# Patient Record
Sex: Female | Born: 1975 | Race: White | Hispanic: No | Marital: Single | Smoking: Current every day smoker
Health system: Southern US, Community
[De-identification: ages and names within clinical notes are randomized; demographics above are authoritative.]

## PROBLEM LIST (undated history)

## (undated) DIAGNOSIS — E119 Type 2 diabetes mellitus without complications: Secondary | ICD-10-CM

## (undated) HISTORY — PX: TUBAL LIGATION: SHX77

---

## 2006-02-02 ENCOUNTER — Emergency Department: Payer: Self-pay | Admitting: Emergency Medicine

## 2007-07-29 ENCOUNTER — Ambulatory Visit: Payer: Self-pay | Admitting: Family Medicine

## 2008-10-25 ENCOUNTER — Ambulatory Visit: Payer: Self-pay | Admitting: Pain Medicine

## 2010-01-30 ENCOUNTER — Emergency Department: Payer: Self-pay | Admitting: Internal Medicine

## 2011-10-13 ENCOUNTER — Emergency Department: Payer: Self-pay | Admitting: Unknown Physician Specialty

## 2011-10-13 LAB — COMPREHENSIVE METABOLIC PANEL
Albumin: 4 g/dL (ref 3.4–5.0)
Alkaline Phosphatase: 80 U/L (ref 50–136)
Anion Gap: 3 — ABNORMAL LOW (ref 7–16)
BUN: 17 mg/dL (ref 7–18)
Calcium, Total: 8.9 mg/dL (ref 8.5–10.1)
Chloride: 107 mmol/L (ref 98–107)
Co2: 27 mmol/L (ref 21–32)
Creatinine: 1.07 mg/dL (ref 0.60–1.30)
EGFR (African American): >60
EGFR (Non-African Amer.): >60
Osmolality: 275 (ref 275–301)
SGOT(AST): 13 U/L — ABNORMAL LOW (ref 15–37)
SGPT (ALT): 32 U/L
Sodium: 137 mmol/L (ref 136–145)

## 2011-10-13 LAB — CBC
HCT: 40.9 % (ref 35.0–47.0)
MCH: 29.1 pg (ref 26.0–34.0)
MCHC: 33.2 g/dL (ref 32.0–36.0)
MCV: 88 fL (ref 80–100)
RBC: 4.68 10*6/uL (ref 3.80–5.20)
RDW: 13.9 % (ref 11.5–14.5)

## 2012-09-11 ENCOUNTER — Emergency Department: Payer: Self-pay | Admitting: Emergency Medicine

## 2012-09-11 LAB — URINALYSIS, COMPLETE
Bacteria: NONE SEEN
Bilirubin,UR: NEGATIVE
Bilirubin,UR: NEGATIVE
Glucose,UR: NEGATIVE mg/dL (ref 0–75)
Leukocyte Esterase: NEGATIVE
Nitrite: NEGATIVE
Nitrite: NEGATIVE
Ph: 6 (ref 4.5–8.0)
Ph: 6 (ref 4.5–8.0)
Protein: 100
Protein: 30
Squamous Epithelial: 3

## 2012-09-11 LAB — COMPREHENSIVE METABOLIC PANEL
Albumin: 3.5 g/dL (ref 3.4–5.0)
Anion Gap: 9 (ref 7–16)
BUN: 14 mg/dL (ref 7–18)
Co2: 23 mmol/L (ref 21–32)
Creatinine: 0.97 mg/dL (ref 0.60–1.30)
EGFR (African American): >60
Osmolality: 275 (ref 275–301)
Potassium: 3.7 mmol/L (ref 3.5–5.1)
SGOT(AST): 23 U/L (ref 15–37)
SGPT (ALT): 40 U/L (ref 12–78)

## 2012-09-11 LAB — CBC
HCT: 43 % (ref 35.0–47.0)
HGB: 14.7 g/dL (ref 12.0–16.0)
MCH: 28.5 pg (ref 26.0–34.0)
MCV: 83 fL (ref 80–100)
Platelet: 187 10*3/uL (ref 150–440)
RBC: 5.17 10*6/uL (ref 3.80–5.20)
RDW: 13.3 % (ref 11.5–14.5)
WBC: 9.2 10*3/uL (ref 3.6–11.0)

## 2013-04-02 ENCOUNTER — Ambulatory Visit: Payer: Self-pay | Admitting: Urology

## 2013-04-16 ENCOUNTER — Ambulatory Visit: Payer: Self-pay | Admitting: Urology

## 2013-05-01 ENCOUNTER — Encounter: Payer: Self-pay | Admitting: Urology

## 2013-05-20 ENCOUNTER — Ambulatory Visit: Payer: Self-pay | Admitting: Urology

## 2013-06-14 ENCOUNTER — Ambulatory Visit: Payer: Self-pay | Admitting: Urology

## 2014-04-29 ENCOUNTER — Emergency Department: Payer: Self-pay | Admitting: Student

## 2015-05-16 ENCOUNTER — Encounter: Payer: Self-pay | Admitting: Emergency Medicine

## 2015-05-16 ENCOUNTER — Emergency Department
Admission: EM | Admit: 2015-05-16 | Discharge: 2015-05-16 | Disposition: A | Discharge status: Home / Self Care | Payer: Medicaid Other | Attending: Emergency Medicine | Admitting: Emergency Medicine

## 2015-05-16 DIAGNOSIS — F17200 Nicotine dependence, unspecified, uncomplicated: Secondary | ICD-10-CM | POA: Insufficient documentation

## 2015-05-16 DIAGNOSIS — R42 Dizziness and giddiness: Secondary | ICD-10-CM | POA: Diagnosis not present

## 2015-05-16 DIAGNOSIS — R112 Nausea with vomiting, unspecified: Secondary | ICD-10-CM | POA: Diagnosis not present

## 2015-05-16 DIAGNOSIS — E119 Type 2 diabetes mellitus without complications: Secondary | ICD-10-CM | POA: Diagnosis not present

## 2015-05-16 HISTORY — DX: Type 2 diabetes mellitus without complications: E11.9

## 2015-05-16 LAB — CBC
HCT: 42.3 % (ref 35.0–47.0)
Hemoglobin: 13.7 g/dL (ref 12.0–16.0)
MCH: 27.4 pg (ref 26.0–34.0)
MCHC: 32.3 g/dL (ref 32.0–36.0)
MCV: 84.8 fL (ref 80.0–100.0)
Platelets: 293 10*3/uL (ref 150–440)
RBC: 4.99 MIL/uL (ref 3.80–5.20)
RDW: 14.3 % (ref 11.5–14.5)
WBC: 13.4 10*3/uL — AB (ref 3.6–11.0)

## 2015-05-16 LAB — BASIC METABOLIC PANEL
Anion gap: 7 (ref 5–15)
BUN: 15 mg/dL (ref 6–20)
CHLORIDE: 106 mmol/L (ref 101–111)
CO2: 27 mmol/L (ref 22–32)
Calcium: 9.2 mg/dL (ref 8.9–10.3)
Creatinine, Ser: 0.9 mg/dL (ref 0.44–1.00)
GFR calc Af Amer: >60 mL/min (ref >60)
GFR calc non Af Amer: >60 mL/min (ref >60)
GLUCOSE: 150 mg/dL — AB (ref 65–99)
POTASSIUM: 4.1 mmol/L (ref 3.5–5.1)
SODIUM: 140 mmol/L (ref 135–145)

## 2015-05-16 LAB — TROPONIN I: Troponin I: <0.03 ng/mL (ref <0.031)

## 2015-05-16 NOTE — ED Notes (Addendum)
Pt has been on byetta insulin shot for one yr.  It frequently makes her feel bad but past 2 days has been dizziness and nausea/vomiting.  Feels like going to pass out.  Blood sugar with ems 188. Endocrinologist aware med makes her sick and cannot see her until mid February.  Also c/o headache.

## 2015-05-17 ENCOUNTER — Telehealth: Payer: Self-pay | Admitting: Emergency Medicine

## 2015-05-17 ENCOUNTER — Emergency Department
Admission: EM | Admit: 2015-05-17 | Discharge: 2015-05-17 | Disposition: A | Discharge status: Home / Self Care | Payer: Medicaid Other | Attending: Emergency Medicine | Admitting: Emergency Medicine

## 2015-05-17 ENCOUNTER — Emergency Department: Payer: Medicaid Other

## 2015-05-17 ENCOUNTER — Encounter: Payer: Self-pay | Admitting: Emergency Medicine

## 2015-05-17 DIAGNOSIS — R42 Dizziness and giddiness: Secondary | ICD-10-CM

## 2015-05-17 DIAGNOSIS — F17200 Nicotine dependence, unspecified, uncomplicated: Secondary | ICD-10-CM | POA: Insufficient documentation

## 2015-05-17 DIAGNOSIS — G43809 Other migraine, not intractable, without status migrainosus: Secondary | ICD-10-CM

## 2015-05-17 DIAGNOSIS — R51 Headache: Secondary | ICD-10-CM | POA: Diagnosis present

## 2015-05-17 DIAGNOSIS — E119 Type 2 diabetes mellitus without complications: Secondary | ICD-10-CM | POA: Diagnosis not present

## 2015-05-17 LAB — URINALYSIS COMPLETE WITH MICROSCOPIC (ARMC ONLY)
GLUCOSE, UA: NEGATIVE mg/dL
HGB URINE DIPSTICK: NEGATIVE
LEUKOCYTES UA: NEGATIVE
NITRITE: NEGATIVE
Protein, ur: 100 mg/dL — AB
SPECIFIC GRAVITY, URINE: 1.039 — AB (ref 1.005–1.030)
pH: 5 (ref 5.0–8.0)

## 2015-05-17 LAB — GLUCOSE, CAPILLARY: GLUCOSE-CAPILLARY: 115 mg/dL — AB (ref 65–99)

## 2015-05-17 MED ORDER — MECLIZINE HCL 25 MG PO TABS: 25.0000 mg | ORAL_TABLET | Freq: Three times a day (TID) | ORAL | Status: DC | PRN

## 2015-05-17 NOTE — ED Provider Notes (Signed)
[facility] Emergency Department Provider Note   ____________________________________________  Time seen: ~1935  I have reviewed the triage vital signs and the nursing notes.   HISTORY  Chief Complaint Headache   History limited by: Not Limited   HPI Angela Velasquez is a 40 y.o. female who presented to the emergency department with concerns for continued episodes of dizziness. The patient states that she has been having dizziness with some nausea and vomiting for the past roughly 3 days. She states that the episodes will happen seemingly at random. She has woken up from sleep because of these episodes. They will last 5-10 minutes. She describes a sense of the room spinning around her. She denied any associated chest pain or shortness of breath. She did have a headache earlier today. She states it is like her migraines. She was seen at her primary care doctor where she had a shot of Toradol which helped relieve the headache.     Past Medical History  Diagnosis Date  . Diabetes mellitus without complication (HCC)     There are no active problems to display for this patient.   Past Surgical History  Procedure Laterality Date  . Tubal ligation      No current outpatient prescriptions on file.  Allergies Review of patient's allergies indicates no known allergies.  No family history on file.  Social History Social History  Substance Use Topics  . Smoking status: Current Every Day Smoker  . Smokeless tobacco: None  . Alcohol Use: No    Review of Systems  Constitutional: Negative for fever. Cardiovascular: Negative for chest pain. Respiratory: Negative for shortness of breath. Gastrointestinal: Negative for abdominal pain, vomiting and diarrhea. Neurological: Positive for headache  10-point ROS otherwise negative.  ____________________________________________   PHYSICAL EXAM:  VITAL SIGNS: ED Triage Vitals  Enc Vitals Group     BP  05/17/15 1746 135/76 mmHg     Pulse Rate 05/17/15 1746 69     Resp 05/17/15 1746 20     Temp --      Temp src --      SpO2 05/17/15 1746 96 %     Weight --      Height --      Head Cir --      Peak Flow --      Pain Score 05/17/15 1457 5     Pain Loc --    Constitutional: Alert and oriented. Well appearing and in no distress. Eyes: Conjunctivae are normal. PERRL. Normal extraocular movements. ENT   Head: Normocephalic and atraumatic.   Nose: No congestion/rhinnorhea.   Mouth/Throat: Mucous membranes are moist.   Neck: No stridor. Hematological/Lymphatic/Immunilogical: No cervical lymphadenopathy. Cardiovascular: Normal rate, regular rhythm.  No murmurs, rubs, or gallops. Respiratory: Normal respiratory effort without tachypnea nor retractions. Breath sounds are clear and equal bilaterally. No wheezes/rales/rhonchi. Gastrointestinal: Soft and nontender. No distention. There is no CVA tenderness. Genitourinary: Deferred Musculoskeletal: Normal range of motion in all extremities. No joint effusions.  No lower extremity tenderness nor edema. Neurologic:  Normal speech and language. No gross focal neurologic deficits are appreciated.  Skin:  Skin is warm, dry and intact. No rash noted. Psychiatric: Mood and affect are normal. Speech and behavior are normal. Patient exhibits appropriate insight and judgment.  ____________________________________________    LABS (pertinent positives/negatives)  Labs Reviewed  GLUCOSE, CAPILLARY - Abnormal; Notable for the following:    Glucose-Capillary 115 (*)    All other components within normal limits  URINALYSIS COMPLETEWITH MICROSCOPIC ([facility] ONLY) - Abnormal; Notable for the following:    Color, Urine AMBER (*)    APPearance CLOUDY (*)    Bilirubin Urine 1+ (*)    Ketones, ur TRACE (*)    Specific Gravity, Urine 1.039 (*)    Protein, ur 100 (*)    Bacteria, UA RARE (*)    Squamous Epithelial / LPF 6-30 (*)    All other  components within normal limits  URINE CULTURE     ____________________________________________   EKG  None  ____________________________________________    RADIOLOGY  CT head  IMPRESSION: No acute intracranial abnormality.   ____________________________________________   PROCEDURES  Procedure(s) performed: None  Critical Care performed: No  ____________________________________________   INITIAL IMPRESSION / ASSESSMENT AND PLAN / ED COURSE  Pertinent labs & imaging results that were available during my care of the patient were reviewed by me and considered in my medical decision making (see chart for details).  Patient presented to the emergency department today because of continued episodes of intermittent dizziness. On exam patient without any current dizziness. No focal neuro findings. Given clinical history of intermittent episodes and episodes are denied and do wonder positional vertigo could be the etiology of symptoms. I doubt a central lesion given the intermittent nature. I also discussed with patient that she could be having intermittent arrhythmias given clinical story and that she should follow up with primary care and possibly cardiology. Patient's blood work yesterday without concerning findings. Patient's urine today with some white blood cells of her some squamous epithelials well. Will send off for a culture.  ____________________________________________   FINAL CLINICAL IMPRESSION(S) / ED DIAGNOSES  Final diagnoses:  Dizziness  Other type of migraine     Phineas Semen, MD 05/17/15 2238

## 2015-05-17 NOTE — ED Notes (Signed)
Family member reported to registration out front that pt in subwait texting family members that her "sugar is low and she has not ate all day." I reported this to Amy C., RN and she told me to check pt blood glucose. Did so and resulted 115. Amy C., RN notified of these results.

## 2015-05-17 NOTE — Discharge Instructions (Signed)
Please seek medical attention for any high fevers, chest pain, shortness of breath, change in behavior, persistent vomiting, bloody stool or any other new or concerning symptoms. ° ° °Dizziness °Dizziness is a common problem. It is a feeling of unsteadiness or light-headedness. You may feel like you are about to faint. Dizziness can lead to injury if you stumble or fall. Anyone can become dizzy, but dizziness is more common in older adults. This condition can be caused by a number of things, including medicines, dehydration, or illness. °HOME CARE INSTRUCTIONS °Taking these steps may help with your condition: °Eating and Drinking °· Drink enough fluid to keep your urine clear or pale yellow. This helps to keep you from becoming dehydrated. Try to drink more clear fluids, such as water. °· Do not drink alcohol. °· Limit your caffeine intake if directed by your health care provider. °· Limit your salt intake if directed by your health care provider. °Activity °· Avoid making quick movements. °¨ Rise slowly from chairs and steady yourself until you feel okay. °¨ In the morning, first sit up on the side of the bed. When you feel okay, stand slowly while you hold onto something until you know that your balance is fine. °· Move your legs often if you need to stand in one place for a long time. Tighten and relax your muscles in your legs while you are standing. °· Do not drive or operate heavy machinery if you feel dizzy. °· Avoid bending down if you feel dizzy. Place items in your home so that they are easy for you to reach without leaning over. °Lifestyle °· Do not use any tobacco products, including cigarettes, chewing tobacco, or electronic cigarettes. If you need help quitting, ask your health care provider. °· Try to reduce your stress level, such as with yoga or meditation. Talk with your health care provider if you need help. °General Instructions °· Watch your dizziness for any changes. °· Take medicines only as  directed by your health care provider. Talk with your health care provider if you think that your dizziness is caused by a medicine that you are taking. °· Tell a friend or a family member that you are feeling dizzy. If he or she notices any changes in your behavior, have this person call your health care provider. °· Keep all follow-up visits as directed by your health care provider. This is important. °SEEK MEDICAL CARE IF: °· Your dizziness does not go away. °· Your dizziness or light-headedness gets worse. °· You feel nauseous. °· You have reduced hearing. °· You have new symptoms. °· You are unsteady on your feet or you feel like the room is spinning. °SEEK IMMEDIATE MEDICAL CARE IF: °· You vomit or have diarrhea and are unable to eat or drink anything. °· You have problems talking, walking, swallowing, or using your arms, hands, or legs. °· You feel generally weak. °· You are not thinking clearly or you have trouble forming sentences. It may take a friend or family member to notice this. °· You have chest pain, abdominal pain, shortness of breath, or sweating. °· Your vision changes. °· You notice any bleeding. °· You have a headache. °· You have neck pain or a stiff neck. °· You have a fever. °  °This information is not intended to replace advice given to you by your health care provider. Make sure you discuss any questions you have with your health care provider. °  °Document Released: 09/26/2000 Document Revised: 08/17/2014   Document Reviewed: 03/29/2014 °Elsevier Interactive Patient Education ©2016 [employer] ° °

## 2015-05-17 NOTE — ED Notes (Signed)
Patient presents to the ED with headache and dizziness with nausea and vomiting.  Patient reports history of migraines but states she has never felt dizzy from the migraines previously.  Patient states headache began on Sunday.  Patient was seen in ED yesterday but left prior to being seen by a provider.  Patient went to [facility] today and was given toradol  which improved the headache but about 10 minutes after being given the medication patient states she "slid to the floor".  Patient states she was awake and alert the entire time but had just felt very dizzy.  Patient is alert and oriented at this time.  Ambulatory to triage.  Denies dizziness at this time.

## 2015-05-17 NOTE — ED Notes (Signed)
Called patient due to lwot to inquire about condition and follow up plans. Pt says she has appt today at 1pm. She agrees to let them know about labs done here.

## 2015-05-19 LAB — URINE CULTURE

## 2016-08-16 ENCOUNTER — Encounter: Payer: Self-pay | Admitting: Emergency Medicine

## 2016-08-16 ENCOUNTER — Emergency Department
Admission: EM | Admit: 2016-08-16 | Discharge: 2016-08-16 | Disposition: A | Discharge status: Home / Self Care | Payer: Medicaid Other | Attending: Emergency Medicine | Admitting: Emergency Medicine

## 2016-08-16 ENCOUNTER — Emergency Department: Payer: Medicaid Other

## 2016-08-16 DIAGNOSIS — R1110 Vomiting, unspecified: Secondary | ICD-10-CM | POA: Insufficient documentation

## 2016-08-16 DIAGNOSIS — R05 Cough: Secondary | ICD-10-CM | POA: Diagnosis present

## 2016-08-16 DIAGNOSIS — F17200 Nicotine dependence, unspecified, uncomplicated: Secondary | ICD-10-CM | POA: Diagnosis not present

## 2016-08-16 DIAGNOSIS — E119 Type 2 diabetes mellitus without complications: Secondary | ICD-10-CM | POA: Diagnosis not present

## 2016-08-16 DIAGNOSIS — J3489 Other specified disorders of nose and nasal sinuses: Secondary | ICD-10-CM | POA: Diagnosis not present

## 2016-08-16 DIAGNOSIS — R0981 Nasal congestion: Secondary | ICD-10-CM | POA: Diagnosis not present

## 2016-08-16 DIAGNOSIS — Z79899 Other long term (current) drug therapy: Secondary | ICD-10-CM | POA: Diagnosis not present

## 2016-08-16 DIAGNOSIS — R0602 Shortness of breath: Secondary | ICD-10-CM | POA: Insufficient documentation

## 2016-08-16 DIAGNOSIS — R0989 Other specified symptoms and signs involving the circulatory and respiratory systems: Secondary | ICD-10-CM | POA: Diagnosis not present

## 2016-08-16 DIAGNOSIS — Z7984 Long term (current) use of oral hypoglycemic drugs: Secondary | ICD-10-CM | POA: Insufficient documentation

## 2016-08-16 DIAGNOSIS — R059 Cough, unspecified: Secondary | ICD-10-CM

## 2016-08-16 DIAGNOSIS — R42 Dizziness and giddiness: Secondary | ICD-10-CM | POA: Insufficient documentation

## 2016-08-16 LAB — BASIC METABOLIC PANEL
ANION GAP: 7 (ref 5–15)
BUN: 12 mg/dL (ref 6–20)
CO2: 25 mmol/L (ref 22–32)
Calcium: 9.2 mg/dL (ref 8.9–10.3)
Chloride: 106 mmol/L (ref 101–111)
Creatinine, Ser: 1.1 mg/dL — ABNORMAL HIGH (ref 0.44–1.00)
GFR calc Af Amer: >60 mL/min (ref >60)
Glucose, Bld: 132 mg/dL — ABNORMAL HIGH (ref 65–99)
POTASSIUM: 4 mmol/L (ref 3.5–5.1)
Sodium: 138 mmol/L (ref 135–145)

## 2016-08-16 LAB — CBC WITH DIFFERENTIAL/PLATELET
BASOS PCT: 1 %
Basophils Absolute: 0.1 10*3/uL (ref 0–0.1)
EOS PCT: 1 %
Eosinophils Absolute: 0.2 10*3/uL (ref 0–0.7)
HCT: 43.7 % (ref 35.0–47.0)
Hemoglobin: 14.6 g/dL (ref 12.0–16.0)
LYMPHS PCT: 22 %
Lymphs Abs: 3 10*3/uL (ref 1.0–3.6)
MCH: 28.7 pg (ref 26.0–34.0)
MCHC: 33.5 g/dL (ref 32.0–36.0)
MCV: 85.7 fL (ref 80.0–100.0)
Monocytes Absolute: 1 10*3/uL — ABNORMAL HIGH (ref 0.2–0.9)
Monocytes Relative: 7 %
Neutro Abs: 9.3 10*3/uL — ABNORMAL HIGH (ref 1.4–6.5)
Neutrophils Relative %: 69 %
PLATELETS: 269 10*3/uL (ref 150–440)
RBC: 5.09 MIL/uL (ref 3.80–5.20)
RDW: 13.6 % (ref 11.5–14.5)
WBC: 13.6 10*3/uL — ABNORMAL HIGH (ref 3.6–11.0)

## 2016-08-16 LAB — BRAIN NATRIURETIC PEPTIDE: B NATRIURETIC PEPTIDE 5: 12 pg/mL (ref 0.0–100.0)

## 2016-08-16 MED ORDER — SODIUM CHLORIDE 0.9 % IV BOLUS (SEPSIS)
1000.0000 mL | Freq: Once | INTRAVENOUS | Status: AC
  Administered 2016-08-16: 1000 mL via INTRAVENOUS

## 2016-08-16 MED ORDER — MECLIZINE HCL 25 MG PO TABS
25.0000 mg | ORAL_TABLET | Freq: Once | ORAL | Status: AC
  Administered 2016-08-16: 25 mg via ORAL
  Filled 2016-08-16: qty 1

## 2016-08-16 MED ORDER — MECLIZINE HCL 25 MG PO TABS: 25.0000 mg | ORAL_TABLET | Freq: Three times a day (TID) | ORAL | 0 refills | Status: DC | PRN

## 2016-08-16 MED ORDER — ONDANSETRON HCL 4 MG PO TABS: 4.0000 mg | ORAL_TABLET | Freq: Three times a day (TID) | ORAL | 0 refills | Status: DC | PRN

## 2016-08-16 MED ORDER — ALBUTEROL SULFATE HFA 108 (90 BASE) MCG/ACT IN AERS: 2.0000 | INHALATION_SPRAY | Freq: Four times a day (QID) | RESPIRATORY_TRACT | 2 refills | Status: DC | PRN

## 2016-08-16 MED ORDER — IPRATROPIUM-ALBUTEROL 0.5-2.5 (3) MG/3ML IN SOLN
3.0000 mL | Freq: Once | RESPIRATORY_TRACT | Status: AC
  Administered 2016-08-16: 3 mL via RESPIRATORY_TRACT
  Filled 2016-08-16: qty 3

## 2016-08-16 NOTE — ED Triage Notes (Addendum)
Patient ambulatory to triage with steady gait, without difficulty or distress noted; pt reports awoke this am with N/V and prod cough white sputum; denies any abd pain

## 2016-08-16 NOTE — ED Notes (Signed)
Pt reports post-tussive emesis; reports cough and congestion x2 days. Denies fever or chest pain.

## 2016-08-16 NOTE — ED Provider Notes (Addendum)
[facility] Emergency Department Provider Note  ____________________________________________   I have reviewed the triage vital signs and the nursing notes.   HISTORY  Chief Complaint Cough and Emesis     HPI Angela Velasquez is a 41 y.o. female  with a history of vertigo in the past presents today with vertigo symptoms also cough and URI symptoms. Patient has runny nose and cough for the last day or so. Has coughed so hard she vomited. She denies any chest pain, she has no personal or family history of PE or DVT, she is not on birth control she's had no recent travel, she's had no leg swelling, no recent surgery, patient states that she feels lightheaded and also may be "a little woozy" she denies a focal numbness or weakness. Is difficult to tell she is endorsing true vertigo because a very vaguely described symptom. She denies any abdominal pain chest pain, side from her cough she does not feel markedly short of breath. She does have history diabetes she states her sugars are well-controlled. She denies fever she denies chills. She states that she's had these symptoms for the last couple days. Patient had a CT scan of the head for similar lightheadedness a year ago. She states a found a cause and she doesn't recall whether they never gave her any medication. Her symptoms of lightheadedness or mostly positional, worse when she stands up and when she sits still, no visual change.      Past Medical History:  Diagnosis Date  . Diabetes mellitus without complication (HCC)     There are no active problems to display for this patient.   Past Surgical History:  Procedure Laterality Date  . TUBAL LIGATION      Prior to Admission medications   Medication Sig Start Date End Date Taking? Authorizing Provider  meclizine (ANTIVERT) 25 MG tablet Take 1 tablet (25 mg total) by mouth 3 (three) times daily as needed for dizziness. 05/17/15   Phineas Semen, MD     Allergies Patient has no known allergies.  No family history on file.  Social History Social History  Substance Use Topics  . Smoking status: Current Every Day Smoker  . Smokeless tobacco: Never Used  . Alcohol use No    Review of Systems Constitutional: No fever/chills Eyes: No visual changes. ENT: No sore throat. No stiff neck no neck pain Cardiovascular: Denies chest pain. Respiratory: Denies shortness of breath. Gastrointestinal:   no vomiting.  No diarrhea.  No constipation. Genitourinary: Negative for dysuria. Musculoskeletal: Negative lower extremity swelling Skin: Negative for rash. Neurological: Negative for severe headaches, focal weakness or numbness. 10-point ROS otherwise negative.  ____________________________________________   PHYSICAL EXAM:  VITAL SIGNS: ED Triage Vitals  Enc Vitals Group     BP 08/16/16 0647 (!) 147/86     Pulse Rate 08/16/16 0647 (!) 116     Resp 08/16/16 0647 20     Temp 08/16/16 0647 98.1 F (36.7 C)     Temp Source 08/16/16 0647 Oral     SpO2 08/16/16 0647 96 %     Weight 08/16/16 0642 230 lb (104.3 kg)     Height 08/16/16 0642 5\' 7"  (1.702 m)     Head Circumference --      Peak Flow --      Pain Score 08/16/16 0649 7     Pain Loc --      Pain Edu? --      Excl. in GC? --  Constitutional: Alert and oriented. Well appearing and in no acute distress.Mildly anxious. Eyes: Conjunctivae are normal. PERRL. EOMI. Head: Atraumatic. Nose: + congestion/rhinnorhea. Mouth/Throat: Mucous membranes are moist.  Oropharynx non-erythematous. Neck: No stridor.   Nontender with no meningismus Cardiovascular: Normal rate, regular rhythm. Grossly normal heart sounds.  Good peripheral circulation. Respiratory: Normal respiratory effort.  No retractions. Lungs slightly course. Abdominal: Soft and nontender. No distention. No guarding no rebound Back:  There is no focal tenderness or step off.  there is no midline tenderness there are  no lesions noted. there is no CVA tenderness Musculoskeletal: No lower extremity tenderness, no upper extremity tenderness. No joint effusions, no DVT signs strong distal pulses no edema Neurologic:  Cranial nerves II through XII are grossly intact 5 out of 5 strength bilateral upper and lower extremity. Finger to nose within normal limits heel to shin within normal limits, speech is normal with no word finding difficulty or dysarthria, reflexes symmetric, pupils are equally round and reactive to light, there is no pronator drift, sensation is normal, vision is intact to confrontation, gait is deferred, there is no nystagmus, normal neurologic exam Skin:  Skin is warm, dry and intact. No rash noted. Psychiatric: Mood and affect are normal. Speech and behavior are normal.  ____________________________________________   LABS (all labs ordered are listed, but only abnormal results are displayed)  Labs Reviewed  BASIC METABOLIC PANEL  CBC WITH DIFFERENTIAL/PLATELET  BRAIN NATRIURETIC PEPTIDE   ____________________________________________  EKG  I personally interpreted any EKGs ordered by me or triage Sinus rhythm rate 98 borderline RAD, no acute ST elevation or acute ST depression, no acute ischemic changes QTc is 342. ____________________________________________  RADIOLOGY  I reviewed any imaging ordered by me or triage that were performed during my shift and, if possible, patient and/or family made aware of any abnormal findings. ____________________________________________   PROCEDURES  Procedure(s) performed: None  Procedures  Critical Care performed: None  ____________________________________________   INITIAL IMPRESSION / ASSESSMENT AND PLAN / ED COURSE  Pertinent labs & imaging results that were available during my care of the patient were reviewed by me and considered in my medical decision making (see chart for details). Patient here with multiple symptoms associated  with URI, she has cough, she has posttussive emesis, her exam is very benign. Slightly course breath sounds. Heart rate was initially 116 she states she hasn't been anxious about this. However with no intervention it is now 99. Her sats are 98%, she is not working to breathe, there is no evidence of pneumonia chest x-ray is reassuring. Patient also has a feeling of lightheadedness which is not unusual with URIs. She did have her recent negative CT scan for this exact same symptom. I don't think imaging is indicated. We will give her IV fluid though to see if it helps her lightheadedness she does endorse some mild degree of vertiginous symptoms so we will give her Antivert which seemed to help her last time. Her TMs are normal bilaterally, there is no evidence of otitis media, there is no evidence of CVA, there is no evidence of centrally mediated vertigo, there is no evidence of PE ACS or dissection. Patient is here with URI symptoms. Is my hope after IV fluids she will feel better.   Clinical Course as of Aug 16 901  Thu Aug 16, 2016  [id number] Patient states feels much better after a breathing treatment, she states her dizziness is completely gone, her cough is much better, was at this time  are clear, initially they were somewhat coarse but there is complete clarity at this time. She is moving good air, there is no evidence of pneumonia. She likely has a bronchitic process. Don't think antibiotics are indicated. Again, very clear cough URI symptoms don't think this likely represents ACS PE or dissection, no evidence of pneumothorax, no evidence of endocarditis myocarditis pericarditis pneumothorax noted. Nothing to suggest anemia, or acute renal failure. We will treat the patient symptomatically and I think it is to be hoped that should be sufficient. Return precautions and follow-up are given and understood. Patient states that she do longer feels lightheaded or "dizzy" in any way. Sats are good, and again lungs  are clear moving good air.  [JM]    Clinical Course User Index [JM] Jeanmarie Plant, MD   ____________________________________________   FINAL CLINICAL IMPRESSION(S) / ED DIAGNOSES  Final diagnoses:  None      This chart was dictated using voice recognition software.  Despite best efforts to proofread,  errors can occur which can change meaning.      Jeanmarie Plant, MD 08/16/16 1610    Jeanmarie Plant, MD 08/16/16 9604    Jeanmarie Plant, MD 08/16/16 [id number]

## 2016-08-16 NOTE — ED Notes (Signed)
E-signature box not working. Pt verbalized understanding of discharge instructions and denied questions. Printed copy of attestation signed.

## 2016-08-21 ENCOUNTER — Emergency Department: Payer: Medicaid Other

## 2016-08-21 ENCOUNTER — Inpatient Hospital Stay
Admission: EM | Admit: 2016-08-21 | Discharge: 2016-08-22 | DRG: 202 | Disposition: A | Discharge status: Home / Self Care | Payer: Medicaid Other | Attending: Internal Medicine | Admitting: Internal Medicine

## 2016-08-21 DIAGNOSIS — Z79899 Other long term (current) drug therapy: Secondary | ICD-10-CM | POA: Diagnosis not present

## 2016-08-21 DIAGNOSIS — E785 Hyperlipidemia, unspecified: Secondary | ICD-10-CM | POA: Diagnosis present

## 2016-08-21 DIAGNOSIS — F17200 Nicotine dependence, unspecified, uncomplicated: Secondary | ICD-10-CM | POA: Diagnosis present

## 2016-08-21 DIAGNOSIS — R0902 Hypoxemia: Secondary | ICD-10-CM | POA: Diagnosis present

## 2016-08-21 DIAGNOSIS — R000 Tachycardia, unspecified: Secondary | ICD-10-CM | POA: Diagnosis present

## 2016-08-21 DIAGNOSIS — R0602 Shortness of breath: Secondary | ICD-10-CM

## 2016-08-21 DIAGNOSIS — F909 Attention-deficit hyperactivity disorder, unspecified type: Secondary | ICD-10-CM | POA: Diagnosis present

## 2016-08-21 DIAGNOSIS — D72829 Elevated white blood cell count, unspecified: Secondary | ICD-10-CM | POA: Diagnosis present

## 2016-08-21 DIAGNOSIS — J209 Acute bronchitis, unspecified: Secondary | ICD-10-CM | POA: Diagnosis present

## 2016-08-21 DIAGNOSIS — T380X5A Adverse effect of glucocorticoids and synthetic analogues, initial encounter: Secondary | ICD-10-CM | POA: Diagnosis present

## 2016-08-21 DIAGNOSIS — Z794 Long term (current) use of insulin: Secondary | ICD-10-CM | POA: Diagnosis not present

## 2016-08-21 DIAGNOSIS — E119 Type 2 diabetes mellitus without complications: Secondary | ICD-10-CM | POA: Diagnosis present

## 2016-08-21 DIAGNOSIS — J9601 Acute respiratory failure with hypoxia: Secondary | ICD-10-CM | POA: Diagnosis present

## 2016-08-21 DIAGNOSIS — I10 Essential (primary) hypertension: Secondary | ICD-10-CM | POA: Diagnosis present

## 2016-08-21 DIAGNOSIS — R05 Cough: Secondary | ICD-10-CM

## 2016-08-21 DIAGNOSIS — R062 Wheezing: Secondary | ICD-10-CM

## 2016-08-21 DIAGNOSIS — J40 Bronchitis, not specified as acute or chronic: Secondary | ICD-10-CM

## 2016-08-21 DIAGNOSIS — R059 Cough, unspecified: Secondary | ICD-10-CM

## 2016-08-21 LAB — BASIC METABOLIC PANEL
Anion gap: 9 (ref 5–15)
BUN: 14 mg/dL (ref 6–20)
CHLORIDE: 103 mmol/L (ref 101–111)
CO2: 27 mmol/L (ref 22–32)
Calcium: 8.9 mg/dL (ref 8.9–10.3)
Creatinine, Ser: 1.18 mg/dL — ABNORMAL HIGH (ref 0.44–1.00)
GFR calc non Af Amer: 56 mL/min — ABNORMAL LOW (ref >60)
Glucose, Bld: 163 mg/dL — ABNORMAL HIGH (ref 65–99)
POTASSIUM: 4.1 mmol/L (ref 3.5–5.1)
SODIUM: 139 mmol/L (ref 135–145)

## 2016-08-21 LAB — CBC
HEMATOCRIT: 40.8 % (ref 35.0–47.0)
HEMOGLOBIN: 13.6 g/dL (ref 12.0–16.0)
MCH: 28.7 pg (ref 26.0–34.0)
MCHC: 33.3 g/dL (ref 32.0–36.0)
MCV: 86.3 fL (ref 80.0–100.0)
Platelets: 241 10*3/uL (ref 150–440)
RBC: 4.72 MIL/uL (ref 3.80–5.20)
RDW: 13.8 % (ref 11.5–14.5)
WBC: 16.3 10*3/uL — ABNORMAL HIGH (ref 3.6–11.0)

## 2016-08-21 LAB — GLUCOSE, CAPILLARY
Glucose-Capillary: 281 mg/dL — ABNORMAL HIGH (ref 65–99)
Glucose-Capillary: 293 mg/dL — ABNORMAL HIGH (ref 65–99)

## 2016-08-21 LAB — TROPONIN I

## 2016-08-21 MED ORDER — SENNOSIDES-DOCUSATE SODIUM 8.6-50 MG PO TABS: 1.0000 | ORAL_TABLET | Freq: Every evening | ORAL | Status: DC | PRN

## 2016-08-21 MED ORDER — ROSUVASTATIN CALCIUM 10 MG PO TABS
10.0000 mg | ORAL_TABLET | Freq: Every day | ORAL | Status: DC
  Administered 2016-08-21: 10 mg via ORAL
  Filled 2016-08-21: qty 1

## 2016-08-21 MED ORDER — METHYLPREDNISOLONE SODIUM SUCC 125 MG IJ SOLR
INTRAMUSCULAR | Status: AC
  Administered 2016-08-21: 125 mg via INTRAVENOUS
  Filled 2016-08-21: qty 2

## 2016-08-21 MED ORDER — SODIUM CHLORIDE 0.9 % IV BOLUS (SEPSIS)
1000.0000 mL | Freq: Once | INTRAVENOUS | Status: AC
  Administered 2016-08-21: 1000 mL via INTRAVENOUS

## 2016-08-21 MED ORDER — IPRATROPIUM-ALBUTEROL 0.5-2.5 (3) MG/3ML IN SOLN
3.0000 mL | Freq: Four times a day (QID) | RESPIRATORY_TRACT | Status: DC
  Administered 2016-08-21 – 2016-08-22 (×5): 3 mL via RESPIRATORY_TRACT
  Filled 2016-08-21 (×6): qty 3

## 2016-08-21 MED ORDER — METHYLPREDNISOLONE SODIUM SUCC 125 MG IJ SOLR
125.0000 mg | Freq: Once | INTRAMUSCULAR | Status: AC
  Administered 2016-08-21: 125 mg via INTRAVENOUS

## 2016-08-21 MED ORDER — GLIPIZIDE 5 MG PO TABS
5.0000 mg | ORAL_TABLET | Freq: Every day | ORAL | Status: DC
  Administered 2016-08-21 – 2016-08-22 (×2): 5 mg via ORAL
  Filled 2016-08-21 (×2): qty 1

## 2016-08-21 MED ORDER — IPRATROPIUM-ALBUTEROL 0.5-2.5 (3) MG/3ML IN SOLN
3.0000 mL | Freq: Once | RESPIRATORY_TRACT | Status: AC
  Administered 2016-08-21: 3 mL via RESPIRATORY_TRACT

## 2016-08-21 MED ORDER — METHYLPREDNISOLONE SODIUM SUCC 40 MG IJ SOLR
40.0000 mg | Freq: Two times a day (BID) | INTRAMUSCULAR | Status: DC
  Administered 2016-08-21 – 2016-08-22 (×2): 40 mg via INTRAVENOUS
  Filled 2016-08-21 (×2): qty 1

## 2016-08-21 MED ORDER — ONDANSETRON HCL 4 MG PO TABS: 4.0000 mg | ORAL_TABLET | Freq: Four times a day (QID) | ORAL | Status: DC | PRN

## 2016-08-21 MED ORDER — MECLIZINE HCL 25 MG PO TABS: 25.0000 mg | ORAL_TABLET | Freq: Three times a day (TID) | ORAL | Status: DC | PRN

## 2016-08-21 MED ORDER — MAGNESIUM SULFATE 2 GM/50ML IV SOLN
2.0000 g | Freq: Once | INTRAVENOUS | Status: AC
  Administered 2016-08-21: 2 g via INTRAVENOUS
  Filled 2016-08-21: qty 50

## 2016-08-21 MED ORDER — ENOXAPARIN SODIUM 40 MG/0.4ML ~~LOC~~ SOLN
40.0000 mg | SUBCUTANEOUS | Status: DC
Start: 2016-08-21 — End: 2016-08-22
  Administered 2016-08-21: 40 mg via SUBCUTANEOUS
  Filled 2016-08-21: qty 0.4

## 2016-08-21 MED ORDER — INSULIN GLARGINE 100 UNIT/ML ~~LOC~~ SOLN
40.0000 [IU] | Freq: Every day | SUBCUTANEOUS | Status: DC
  Administered 2016-08-21: 40 [IU] via SUBCUTANEOUS
  Filled 2016-08-21 (×2): qty 0.4

## 2016-08-21 MED ORDER — ONDANSETRON HCL 4 MG/2ML IJ SOLN: 4.0000 mg | Freq: Four times a day (QID) | INTRAMUSCULAR | Status: DC | PRN

## 2016-08-21 MED ORDER — ACETAMINOPHEN 325 MG PO TABS: 650.0000 mg | ORAL_TABLET | Freq: Four times a day (QID) | ORAL | Status: DC | PRN

## 2016-08-21 MED ORDER — IPRATROPIUM-ALBUTEROL 0.5-2.5 (3) MG/3ML IN SOLN
RESPIRATORY_TRACT | Status: AC
  Administered 2016-08-21: 3 mL via RESPIRATORY_TRACT
  Filled 2016-08-21: qty 6

## 2016-08-21 MED ORDER — ONDANSETRON HCL 4 MG PO TABS: 4.0000 mg | ORAL_TABLET | Freq: Three times a day (TID) | ORAL | Status: DC | PRN

## 2016-08-21 MED ORDER — ACETAMINOPHEN 650 MG RE SUPP: 650.0000 mg | Freq: Four times a day (QID) | RECTAL | Status: DC | PRN

## 2016-08-21 MED ORDER — PANTOPRAZOLE SODIUM 40 MG PO TBEC
40.0000 mg | DELAYED_RELEASE_TABLET | Freq: Every day | ORAL | Status: DC
  Administered 2016-08-21: 40 mg via ORAL
  Filled 2016-08-21: qty 1

## 2016-08-21 MED ORDER — HYDROCODONE-ACETAMINOPHEN 5-325 MG PO TABS: 1.0000 | ORAL_TABLET | ORAL | Status: DC | PRN

## 2016-08-21 MED ORDER — LISINOPRIL 10 MG PO TABS
10.0000 mg | ORAL_TABLET | Freq: Every day | ORAL | Status: DC
  Administered 2016-08-21: 10 mg via ORAL
  Filled 2016-08-21: qty 1

## 2016-08-21 MED ORDER — AMPHETAMINE-DEXTROAMPHET ER 30 MG PO CP24
30.0000 mg | ORAL_CAPSULE | Freq: Two times a day (BID) | ORAL | Status: DC
  Filled 2016-08-21: qty 1

## 2016-08-21 MED ORDER — BENZONATATE 100 MG PO CAPS
100.0000 mg | ORAL_CAPSULE | Freq: Three times a day (TID) | ORAL | Status: DC | PRN
  Administered 2016-08-21 – 2016-08-22 (×3): 100 mg via ORAL
  Filled 2016-08-21 (×3): qty 1

## 2016-08-21 MED ORDER — INSULIN ASPART 100 UNIT/ML ~~LOC~~ SOLN
0.0000 [IU] | Freq: Three times a day (TID) | SUBCUTANEOUS | Status: DC
  Administered 2016-08-21 – 2016-08-22 (×3): 11 [IU] via SUBCUTANEOUS
  Filled 2016-08-21 (×3): qty 11

## 2016-08-21 MED ORDER — LURASIDONE HCL 40 MG PO TABS
40.0000 mg | ORAL_TABLET | Freq: Every day | ORAL | Status: DC
  Administered 2016-08-21: 40 mg via ORAL
  Filled 2016-08-21 (×2): qty 1

## 2016-08-21 MED ORDER — CLONAZEPAM 1 MG PO TABS
2.0000 mg | ORAL_TABLET | Freq: Two times a day (BID) | ORAL | Status: DC | PRN
  Administered 2016-08-21 – 2016-08-22 (×2): 2 mg via ORAL
  Filled 2016-08-21 (×2): qty 2

## 2016-08-21 MED ORDER — ALBUTEROL SULFATE (2.5 MG/3ML) 0.083% IN NEBU
INHALATION_SOLUTION | RESPIRATORY_TRACT | Status: AC
  Administered 2016-08-21: 2.5 mg via RESPIRATORY_TRACT
  Filled 2016-08-21: qty 6

## 2016-08-21 MED ORDER — DEXTROSE 5 % IV SOLN
500.0000 mg | Freq: Once | INTRAVENOUS | Status: AC
  Administered 2016-08-21: 500 mg via INTRAVENOUS
  Filled 2016-08-21: qty 500

## 2016-08-21 MED ORDER — ALBUTEROL SULFATE (2.5 MG/3ML) 0.083% IN NEBU
2.5000 mg | INHALATION_SOLUTION | Freq: Once | RESPIRATORY_TRACT | Status: AC
  Administered 2016-08-21: 2.5 mg via RESPIRATORY_TRACT

## 2016-08-21 MED ORDER — AZITHROMYCIN 250 MG PO TABS
250.0000 mg | ORAL_TABLET | Freq: Every day | ORAL | Status: DC
  Administered 2016-08-22: 250 mg via ORAL
  Filled 2016-08-21: qty 1

## 2016-08-21 MED ORDER — INSULIN ASPART 100 UNIT/ML ~~LOC~~ SOLN
0.0000 [IU] | Freq: Every day | SUBCUTANEOUS | Status: DC
  Administered 2016-08-21: 4 [IU] via SUBCUTANEOUS
  Filled 2016-08-21: qty 4

## 2016-08-21 NOTE — ED Notes (Signed)
Patient continues to sat 90% on 4L, O2 increased to 6L, now sats remain at 91-95% range.

## 2016-08-21 NOTE — Plan of Care (Signed)
Problem: Safety: Goal: Ability to remain free from injury will improve Outcome: Progressing Fall precautions in place, non skid socks  Problem: Pain Managment: Goal: General experience of comfort will improve Outcome: Progressing Prn medications  Problem: Tissue Perfusion: Goal: Risk factors for ineffective tissue perfusion will decrease Outcome: Progressing SQ Lovenox   Problem: Respiratory: Goal: Ability to achieve and maintain a regular respiratory rate will improve Outcome: Not Progressing Remains on 5LO2 per [place], will continue to wean

## 2016-08-21 NOTE — ED Triage Notes (Signed)
Pt states she has had cough and shob since Thursday, was seen here the same day and had cxr that was negative. Pt was dc home but states shob and cough are worsening since.

## 2016-08-21 NOTE — ED Notes (Signed)
Patient continued to desat (82-87%) after breathing treatment. Patient placed on [place] with no improvement of sats beyond 89%. Patient now placed on Bipap. Patient's sats 87% on RA after standing to go to restroom prior to Bipap being started.

## 2016-08-21 NOTE — ED Notes (Signed)
Admitting MD at bedside, pt tolerating bipap, awake and alert, family at bedside

## 2016-08-21 NOTE — H&P (Signed)
[facility] [place] [facility] [place] [facility]   PATIENT NAME: Angela Velasquez    MR#:  [id number]  DATE OF BIRTH:  [birth year]  DATE OF ADMISSION:  08/21/2016  PRIMARY CARE PHYSICIAN: [facility]   REQUESTING/REFERRING PHYSICIAN: dr Zenda Alpers  CHIEF COMPLAINT:   sob HISTORY OF PRESENT ILLNESS:  Angela Velasquez  is a 41 y.o. female with a known history of Tobacco dependence and diabetes who presents with above complaint. Over the past few days patient is a recent shortness of breath, cough and wheezing. In the emergency room she is noted to have hypoxia on room air. She was started on nasal cannula however she persistent hypoxia and was placed on high flow then BiPAP. We are currently weaning her off BiPAP. On nasal cannula 4 L she is saturating about 90%. She denies recent travel history or sick contacts. She denies recent hospitalization.  PAST MEDICAL HISTORY:   Past Medical History:  Diagnosis Date  . Diabetes mellitus without complication (HCC)     PAST SURGICAL HISTORY:   Past Surgical History:  Procedure Laterality Date  . TUBAL LIGATION      SOCIAL HISTORY:   Social History  Substance Use Topics  . Smoking status: Current Every Day Smoker  . Smokeless tobacco: Never Used  . Alcohol use No    FAMILY HISTORY:  No family history on file.  DRUG ALLERGIES:  No Known Allergies  REVIEW OF SYSTEMS:   Review of Systems  Constitutional: Negative.  Negative for chills, fever and malaise/fatigue.  HENT: Negative.  Negative for ear discharge, ear pain, hearing loss, nosebleeds and sore throat.   Eyes: Negative.  Negative for blurred vision and pain.  Respiratory: Positive for cough, shortness of breath and wheezing. Negative for hemoptysis and sputum production.   Cardiovascular: Negative.  Negative for chest pain, palpitations and leg swelling.  Gastrointestinal: Negative.  Negative for abdominal pain, blood in stool, diarrhea, nausea and  vomiting.  Genitourinary: Negative.  Negative for dysuria.  Musculoskeletal: Negative.  Negative for back pain.  Skin: Negative.   Neurological: Negative for dizziness, tremors, speech change, focal weakness, seizures and headaches.  Endo/Heme/Allergies: Negative.  Does not bruise/bleed easily.  Psychiatric/Behavioral: Negative for depression, hallucinations and suicidal ideas.       ADHD    MEDICATIONS AT HOME:   Prior to Admission medications   Medication Sig Start Date End Date Taking? Authorizing Provider  ADDERALL XR 30 MG 24 hr capsule Take 30 mg by mouth 2 (two) times daily. Am and 12 noon 08/08/16  Yes [provider]  albuterol (PROVENTIL HFA;VENTOLIN HFA) 108 (90 Base) MCG/ACT inhaler Inhale 2 puffs into the lungs every 6 (six) hours as needed for wheezing or shortness of breath. 08/16/16  Yes Jeanmarie Plant, MD  clonazePAM (KLONOPIN) 2 MG tablet Take 2 mg by mouth 2 (two) times daily. 07/26/16  Yes [provider]  glipiZIDE (GLUCOTROL) 5 MG tablet Take 5 tablets by mouth daily. 08/12/16  Yes [provider]  LANTUS SOLOSTAR 100 UNIT/ML Solostar Pen Inject 40 Units as directed at bedtime. 07/11/16  Yes [provider]  LATUDA 40 MG TABS tablet Take 40 mg by mouth daily. 08/01/16  Yes [provider]  lisinopril (PRINIVIL,ZESTRIL) 10 MG tablet Take 10 mg by mouth daily. 07/11/16  Yes [provider]  meclizine (ANTIVERT) 25 MG tablet Take 1 tablet (25 mg total) by mouth 3 (three) times daily as needed for dizziness. 08/16/16  Yes Ileana Roup  A, MD  metFORMIN (GLUCOPHAGE) 1000 MG tablet Take 1,000 mg by mouth 2 (two) times daily. 07/28/16  Yes [provider]  omeprazole (PRILOSEC) 20 MG capsule Take 20 mg by mouth daily. 07/11/16  Yes [provider]  ondansetron (ZOFRAN) 4 MG tablet Take 1 tablet (4 mg total) by mouth every 8 (eight) hours as needed for nausea or vomiting. 08/16/16  Yes Jeanmarie Plant, MD   rosuvastatin (CRESTOR) 10 MG tablet Take 10 mg by mouth daily. 06/05/16 06/05/17 Yes [provider]      VITAL SIGNS:  Blood pressure 103/67, pulse (!) 119, temperature 98.4 F (36.9 C), temperature source Oral, resp. rate (!) 29, height 5\' 3"  (1.6 m), weight 104.3 kg (230 lb), SpO2 92 %.  PHYSICAL EXAMINATION:   Physical Exam  Constitutional: She is oriented to person, place, and time and well-developed, well-nourished, and in no distress. No distress.  HENT:  Head: Normocephalic.  Eyes: No scleral icterus.  Neck: Normal range of motion. Neck supple. No JVD present. No tracheal deviation present.  Cardiovascular: Normal rate, regular rhythm and normal heart sounds.  Exam reveals no gallop and no friction rub.   No murmur heard. Pulmonary/Chest: Effort normal. No respiratory distress. She has wheezes. She has no rales. She exhibits no tenderness.  Abdominal: Soft. Bowel sounds are normal. She exhibits no distension and no mass. There is no tenderness. There is no rebound and no guarding.  Musculoskeletal: Normal range of motion. She exhibits no edema.  Neurological: She is alert and oriented to person, place, and time.  Skin: Skin is warm. No rash noted. No erythema.  Psychiatric: Affect and judgment normal.      LABORATORY PANEL:   CBC  Recent Labs Lab 08/21/16 0537  WBC 16.3*  HGB 13.6  HCT 40.8  PLT 241   ------------------------------------------------------------------------------------------------------------------  Chemistries   Recent Labs Lab 08/21/16 0537  NA 139  K 4.1  CL 103  CO2 27  GLUCOSE 163*  BUN 14  CREATININE 1.18*  CALCIUM 8.9   ------------------------------------------------------------------------------------------------------------------  Cardiac Enzymes  Recent Labs Lab 08/21/16 0537  TROPONINI <0.03    ------------------------------------------------------------------------------------------------------------------  RADIOLOGY:  Dg Chest Portable 1 View  Result Date: 08/21/2016 CLINICAL DATA:  Cough and shortness of breath since Thursday. Patient was seen here on that day and had a negative chest x-ray. EXAM: PORTABLE CHEST 1 VIEW COMPARISON:  08/16/2016 FINDINGS: The heart size and mediastinal contours are within normal limits. Both lungs are clear. The visualized skeletal structures are unremarkable. IMPRESSION: No active disease. Electronically Signed   By: Burman Nieves M.D.   On: 08/21/2016 06:02    EKG:     IMPRESSION AND PLAN:   41 year old female with tobacco dependence and diabetes who presents with acute hypoxic respiratory failure.  1. Acute hypoxic respiratory failure in the setting of acute bronchitis Wean oxygen as tolerated. Keep O2 greater than 92%  2. Acute bronchitis with acute bronchospasm:  Start IV steroids and azithromycin Continue DuoNeb's  3. Tobacco dependence: Patient is encouraged to quit smoking. Counseling was provided for 4 minutes.   4. Sinus tachycardia due to hypoxia and neb treatments Continue telemetry and monitoring  5. Diabetes: Continue long-acting insulin with sliding scale insulin and diabetic diet  6. Hypertension: Continue lisinopril 7. ADHD on Adderall and clonazepam 8. Hyperlipidemia on statin  All the records are reviewed and case discussed with ED provider. Management plans discussed with the patient and she in agreement  CODE STATUS: full  TOTAL  TIME TAKING CARE OF THIS PATIENT: 45 minutes.    Eberardo Demello M.D on 08/21/2016 at 7:46 AM  Between 7am to 6pm - Pager - [phone]  After 6pm go to [email or web address] - Social research officer, governmentpassword [email or web address]  [facility]  Office  [phone]  CC: Primary care physician; [facility]

## 2016-08-21 NOTE — ED Provider Notes (Signed)
[facility] Emergency Department Provider Note   ____________________________________________   First MD Initiated Contact with Patient 08/21/16 [phone]     (approximate)  I have reviewed the triage vital signs and the nursing notes.   HISTORY  Chief Complaint Cough    HPI Angela Velasquez is a 41 y.o. female who comes into the hospital today with shortness of breath and cough. The patient has been having these symptoms since Thursday. She reports that the cough is productive and white. She does not have a history of COPD but she does smoke. The patient has had nausea and vomiting as well as dizziness. She reports that she is unable to breathe since then. The patient has had pneumonia in the past and reports that this makes her think of pneumonia. The patient has had chills but denies fever. She denies any chest pain or any sick contacts.The patient was seen in the hospital on May 3 with some dizziness and cough as well. The patient was found to be hypoxic in triage. She reports that she just can't breathe and there is nothing that she's been able to take at home.   Past Medical History:  Diagnosis Date  . Diabetes mellitus without complication (HCC)     There are no active problems to display for this patient.   Past Surgical History:  Procedure Laterality Date  . TUBAL LIGATION      Prior to Admission medications   Medication Sig Start Date End Date Taking? Authorizing Provider  ADDERALL XR 30 MG 24 hr capsule Take 30 mg by mouth 2 (two) times daily. Am and 12 noon 08/08/16  Yes [provider]  albuterol (PROVENTIL HFA;VENTOLIN HFA) 108 (90 Base) MCG/ACT inhaler Inhale 2 puffs into the lungs every 6 (six) hours as needed for wheezing or shortness of breath. 08/16/16  Yes Jeanmarie PlantMcShane, James A, MD  clonazePAM (KLONOPIN) 2 MG tablet Take 2 mg by mouth 2 (two) times daily. 07/26/16  Yes [provider]  glipiZIDE (GLUCOTROL) 5 MG tablet Take 5  tablets by mouth daily. 08/12/16  Yes [provider]  LANTUS SOLOSTAR 100 UNIT/ML Solostar Pen Inject 40 Units as directed at bedtime. 07/11/16  Yes [provider]  LATUDA 40 MG TABS tablet Take 40 mg by mouth daily. 08/01/16  Yes [provider]  lisinopril (PRINIVIL,ZESTRIL) 10 MG tablet Take 10 mg by mouth daily. 07/11/16  Yes [provider]  meclizine (ANTIVERT) 25 MG tablet Take 1 tablet (25 mg total) by mouth 3 (three) times daily as needed for dizziness. 08/16/16  Yes Jeanmarie PlantMcShane, James A, MD  metFORMIN (GLUCOPHAGE) 1000 MG tablet Take 1,000 mg by mouth 2 (two) times daily. 07/28/16  Yes [provider]  omeprazole (PRILOSEC) 20 MG capsule Take 20 mg by mouth daily. 07/11/16  Yes [provider]  ondansetron (ZOFRAN) 4 MG tablet Take 1 tablet (4 mg total) by mouth every 8 (eight) hours as needed for nausea or vomiting. 08/16/16  Yes Jeanmarie PlantMcShane, James A, MD  rosuvastatin (CRESTOR) 10 MG tablet Take 10 mg by mouth daily. 06/05/16 06/05/17 Yes [provider]    Allergies Patient has no known allergies.  No family history on file.  Social History Social History  Substance Use Topics  . Smoking status: Current Every Day Smoker  . Smokeless tobacco: Never Used  . Alcohol use No    Review of Systems  Constitutional: No fever/chills Eyes: No visual changes. ENT: No sore throat. Cardiovascular: Denies chest  pain. Respiratory: Cough and shortness of breath. Gastrointestinal: Nausea and vomiting  No diarrhea.  No constipation. Genitourinary: Negative for dysuria. Musculoskeletal: Negative for back pain. Skin: Negative for rash. Neurological: Dizziness   ____________________________________________   PHYSICAL EXAM:  VITAL SIGNS: ED Triage Vitals  Enc Vitals Group     BP 08/21/16 0525 124/82     Pulse Rate 08/21/16 0525 (!) 115     Resp 08/21/16 0525 20     Temp 08/21/16 0525 98.4 F (36.9 C)     Temp Source 08/21/16 0525  Oral     SpO2 08/21/16 0525 (!) 88 %     Weight 08/21/16 0526 230 lb (104.3 kg)     Height 08/21/16 0526 5\' 3"  (1.6 m)     Head Circumference --      Peak Flow --      Pain Score 08/21/16 0525 7     Pain Loc --      Pain Edu? --      Excl. in GC? --     Constitutional: Alert and oriented. Ill appearing and in acute respiratory distress. Eyes: Conjunctivae are normal. PERRL. EOMI. Head: Atraumatic. Nose: No congestion/rhinnorhea. Mouth/Throat: Mucous membranes are moist.  Oropharynx non-erythematous. Cardiovascular: Tachycardia, regular rhythm. Grossly normal heart sounds.  Good peripheral circulation. Respiratory: Increased respiratory effort.  No retractions. Tight expiratory wheezing in all lung fields Gastrointestinal: Soft and nontender. No distention. Positive bowel sounds Musculoskeletal: No lower extremity tenderness nor edema.   Neurologic:  Normal speech and language.  Skin:  Skin is warm, dry and intact.  Psychiatric: Mood and affect are normal.   ____________________________________________   LABS (all labs ordered are listed, but only abnormal results are displayed)  Labs Reviewed  CBC - Abnormal; Notable for the following:       Result Value   WBC 16.3 (*)    All other components within normal limits  BASIC METABOLIC PANEL - Abnormal; Notable for the following:    Glucose, Bld 163 (*)    Creatinine, Ser 1.18 (*)    GFR calc non Af Amer 56 (*)    All other components within normal limits  BLOOD GAS, VENOUS - Abnormal; Notable for the following:    Bicarbonate 29.1 (*)    Acid-Base Excess 2.2 (*)    All other components within normal limits  TROPONIN I   ____________________________________________  EKG  ED ECG REPORT I, Rebecka Apley, the attending physician, personally viewed and interpreted this ECG.   Date: 08/21/2016  EKG Time: 537  Rate: 114  Rhythm: sinus tachycardia  Axis: normal  Intervals:none  ST&T Change:  none  ____________________________________________  RADIOLOGY  CXR ____________________________________________   PROCEDURES  Procedure(s) performed: None  Procedures  Critical Care performed: Yes, see critical care note(s)  .CRITICAL CARE Performed by: Lucrezia Europe P   Total critical care time: 30 minutes  Critical care time was exclusive of separately billable procedures and treating other patients.  Critical care was necessary to treat or prevent imminent or life-threatening deterioration.  Critical care was time spent personally by me on the following activities: development of treatment plan with patient and/or surrogate as well as nursing, discussions with consultants, evaluation of patient's response to treatment, examination of patient, obtaining history from patient or surrogate, ordering and performing treatments and interventions, ordering and review of laboratory studies, ordering and review of radiographic studies, pulse oximetry and re-evaluation of patient's condition.   ____________________________________________   INITIAL IMPRESSION / ASSESSMENT AND  PLAN / ED COURSE  Pertinent labs & imaging results that were available during my care of the patient were reviewed by me and considered in my medical decision making (see chart for details).  This is a 41 year old who comes into the hospital today with shortness of breath and cough. The patient is acutely short of breath and was hypoxic in triage. When she arrived we immediately gave her 2 duo nebs and some Solu-Medrol. Some of her breathing improved but she was still hypoxic. We gave her 2 more albuterol nebs but her O2 sats dropped into the low 80s. The decision was made to place the patient on BiPAP. The patient received some magnesium sulfate as well. I will admit the patient to the hospitalist service for further treatment of her bronchitis. The patient does not have pneumonia but as she is a smoker feel  that she does have some COPD. She will be admitted.  Clinical Course as of Aug 22 715  Tue Aug 21, 2016  0618 No active disease. DG Chest Portable 1 View [AW]    Clinical Course User Index [AW] Rebecka Apley, MD     ____________________________________________   FINAL CLINICAL IMPRESSION(S) / ED DIAGNOSES  Final diagnoses:  Bronchitis  Cough  Shortness of breath  Wheezing      NEW MEDICATIONS STARTED DURING THIS VISIT:  New Prescriptions   No medications on file     Note:  This document was prepared using Dragon voice recognition software and may include unintentional dictation errors.    Rebecka Apley, MD 08/21/16 [id number]

## 2016-08-21 NOTE — Care Management (Signed)
Patient presented to the ED with shortness of breath which initially required continuous bipap.  Currently on nasal cannula at 4-5 liters.  Oxygen requirement is acute.  Prior to this episode of illness, Independent in all adls, denies issues accessing medical care, obtaining medications or with transportation.  Current with her PCP. Discussed the need to wean oxygen and perform home 02 assessment during progression

## 2016-08-21 NOTE — ED Notes (Signed)
Pt on 5L [place], admitting and RT at bedside

## 2016-08-21 NOTE — Progress Notes (Signed)
41 yo wf admitted to room 235 from ED with bronchitis.  A&O x3, gait steady.  No distress on 5LO2 per , pt dyspneic on exertion.  Lungs diminished with exp wheezes bil.  Cardiac monitor applied to and verified with Lars MageJuan, CNA, pt denies chest pain.  SL lt ac/rt hand flushes well.  Daughter at bedside.  Oriented to room and surroundings, POC reviewed with pt and family.  Skin intact, verified with Dorna MaiJancy RN.  Denies need at this time. CB in reach, SR up x 2.

## 2016-08-21 NOTE — ED Notes (Signed)
RT at bedside, pt taken off BIPAP and placed on 2L [place]

## 2016-08-21 NOTE — ED Notes (Addendum)
Patient states she feels better after breathing treatments, but continues to sat at 90% on 3L O2 via [place]. Patient increased to 4L.  Patient reports having episode last night of toilet filled with blood. Denies any pain, reports history of hemorrhoids with similar blood. Dr. Zenda AlpersWebster notified.

## 2016-08-22 LAB — CBC
HEMATOCRIT: 38.7 % (ref 35.0–47.0)
HEMOGLOBIN: 13 g/dL (ref 12.0–16.0)
MCH: 28.3 pg (ref 26.0–34.0)
MCHC: 33.5 g/dL (ref 32.0–36.0)
MCV: 84.4 fL (ref 80.0–100.0)
Platelets: 273 10*3/uL (ref 150–440)
RBC: 4.59 MIL/uL (ref 3.80–5.20)
RDW: 13.3 % (ref 11.5–14.5)
WBC: 22.1 10*3/uL — AB (ref 3.6–11.0)

## 2016-08-22 LAB — BASIC METABOLIC PANEL
ANION GAP: 6 (ref 5–15)
BUN: 20 mg/dL (ref 6–20)
CO2: 27 mmol/L (ref 22–32)
Calcium: 9 mg/dL (ref 8.9–10.3)
Chloride: 105 mmol/L (ref 101–111)
Creatinine, Ser: 0.99 mg/dL (ref 0.44–1.00)
Glucose, Bld: 270 mg/dL — ABNORMAL HIGH (ref 65–99)
POTASSIUM: 4.6 mmol/L (ref 3.5–5.1)
SODIUM: 138 mmol/L (ref 135–145)

## 2016-08-22 LAB — GLUCOSE, CAPILLARY
Glucose-Capillary: 325 mg/dL — ABNORMAL HIGH (ref 65–99)
Glucose-Capillary: 273 mg/dL — ABNORMAL HIGH (ref 65–99)

## 2016-08-22 LAB — HIV ANTIBODY (ROUTINE TESTING W REFLEX): HIV SCREEN 4TH GENERATION: NONREACTIVE

## 2016-08-22 MED ORDER — AZITHROMYCIN 250 MG PO TABS: ORAL_TABLET | ORAL | 0 refills | Status: DC

## 2016-08-22 MED ORDER — BENZONATATE 100 MG PO CAPS: 100.0000 mg | ORAL_CAPSULE | Freq: Three times a day (TID) | ORAL | 0 refills | Status: DC | PRN

## 2016-08-22 MED ORDER — PREDNISONE 10 MG PO TABS: 50.0000 mg | ORAL_TABLET | Freq: Every day | ORAL | 0 refills | Status: DC

## 2016-08-22 NOTE — Progress Notes (Signed)
Patient given discharge teaching and paperwork regarding medications, diet, follow-up appointments and activity. Patient understanding verbalized. No complaints at this time. . IV and telemetry discontinued prior to leaving. Skin assessment as previously charted and vitals are stable; on room air. Patient being discharged to home. Caregiver/family present during discharge teaching. No further needs by Care Management. Prescriptions given to patient.   

## 2016-08-22 NOTE — Discharge Summary (Addendum)
[facility] - [place] at [facility]   PATIENT NAME: Angela Velasquez    MR#:  [id number]  DATE OF BIRTH:  1976-04-10  DATE OF ADMISSION:  08/21/2016 ADMITTING PHYSICIAN: Adrian Saran, MD  DATE OF DISCHARGE: 08/22/2016  PRIMARY CARE PHYSICIAN: [facility]    ADMISSION DIAGNOSIS:  Shortness of breath [R06.02] Wheezing [R06.2] Cough [R05] Bronchitis [J40]  DISCHARGE DIAGNOSIS:  Active Problems:   Hypoxia   SECONDARY DIAGNOSIS:   Past Medical History:  Diagnosis Date  . Diabetes mellitus without complication [facility])     HOSPITAL COURSE:   41 year old female with tobacco dependence and diabetes who presents with acute hypoxic respiratory failure.  1. Acute hypoxic respiratory failure in the setting of acute bronchitis Patient has been weaned off of oxygen. 2. Acute bronchitis with acute bronchospasm:  Patient will be discharged with azithromycin and DuoNeb's. It is recommended the patient have outpatient evaluation by pulmonary to evaluate for COPD. This can be set up by her primary care physician.  3. Tobacco dependence: Patient is encouraged to quit smoking. Counseling was provided for 4 minutes on admission.   4. Sinus tachycardia due to hypoxia and neb treatments Heart rate is in sinus rhythm   5. Diabetes: Will continue outpatient medications with ADA diet 6. Hypertension: Continue lisinopril 7. ADHD on Adderall and clonazepam 8. Hyperlipidemia on statin 9. Elevated white blood cell count due to IV steroids. Patient will need repeat CBC in 3 days.   DISCHARGE CONDITIONS AND DIET:   Stable for discharge on diabetic diet  CONSULTS OBTAINED:    DRUG ALLERGIES:  No Known Allergies  DISCHARGE MEDICATIONS:   Current Discharge Medication List    START taking these medications   Details  azithromycin (ZITHROMAX) 250 MG tablet Take 1 tablet dail Qty: 3 each, Refills: 0    benzonatate (TESSALON) 100 MG capsule Take  1 capsule (100 mg total) by mouth 3 (three) times daily as needed for cough. Qty: 20 capsule, Refills: 0    predniSONE (DELTASONE) 10 MG tablet Take 5 tablets (50 mg total) by mouth daily with breakfast. 50 mg daily for 4 days Qty: 20 tablet, Refills: 0      CONTINUE these medications which have NOT CHANGED   Details  ADDERALL XR 30 MG 24 hr capsule Take 30 mg by mouth 2 (two) times daily. Am and 12 noon Refills: 0    albuterol (PROVENTIL HFA;VENTOLIN HFA) 108 (90 Base) MCG/ACT inhaler Inhale 2 puffs into the lungs every 6 (six) hours as needed for wheezing or shortness of breath. Qty: 1 Inhaler, Refills: 2    clonazePAM (KLONOPIN) 2 MG tablet Take 2 mg by mouth 2 (two) times daily. Refills: 2    glipiZIDE (GLUCOTROL) 5 MG tablet Take 1 tablet by mouth daily.  Refills: 6    LANTUS SOLOSTAR 100 UNIT/ML Solostar Pen Inject 40 Units as directed at bedtime. Refills: 6    LATUDA 40 MG TABS tablet Take 40 mg by mouth daily. Refills: 4    lisinopril (PRINIVIL,ZESTRIL) 10 MG tablet Take 10 mg by mouth daily. Refills: 5    meclizine (ANTIVERT) 25 MG tablet Take 1 tablet (25 mg total) by mouth 3 (three) times daily as needed for dizziness. Qty: 15 tablet, Refills: 0    metFORMIN (GLUCOPHAGE) 1000 MG tablet Take 1,000 mg by mouth 2 (two) times daily. Refills: 6    omeprazole (PRILOSEC) 20 MG capsule Take 20 mg by mouth daily. Refills: 5    ondansetron (  ZOFRAN) 4 MG tablet Take 1 tablet (4 mg total) by mouth every 8 (eight) hours as needed for nausea or vomiting. Qty: 8 tablet, Refills: 0    rosuvastatin (CRESTOR) 10 MG tablet Take 10 mg by mouth daily.          Today   CHIEF COMPLAINT:  Asian doing very well this morning.   VITAL SIGNS:  Blood pressure 114/63, pulse 86, temperature 97.9 F (36.6 C), temperature source Oral, resp. rate (!) 22, height 5\' 7"  (1.702 m), weight 100.2 kg (221 lb), SpO2 94 %.   REVIEW OF SYSTEMS:  Review of Systems  Constitutional:  Negative.  Negative for chills, fever and malaise/fatigue.  HENT: Negative.  Negative for ear discharge, ear pain, hearing loss, nosebleeds and sore throat.   Eyes: Negative.  Negative for blurred vision and pain.  Respiratory: Negative.  Negative for cough, hemoptysis, shortness of breath and wheezing.   Cardiovascular: Negative.  Negative for chest pain, palpitations and leg swelling.  Gastrointestinal: Negative.  Negative for abdominal pain, blood in stool, diarrhea, nausea and vomiting.  Genitourinary: Negative.  Negative for dysuria.  Musculoskeletal: Negative.  Negative for back pain.  Skin: Negative.   Neurological: Negative for dizziness, tremors, speech change, focal weakness, seizures and headaches.  Endo/Heme/Allergies: Negative.  Does not bruise/bleed easily.  Psychiatric/Behavioral: Negative.  Negative for depression, hallucinations and suicidal ideas.     PHYSICAL EXAMINATION:  GENERAL:  41 y.o.-year-old patient lying in the bed with no acute distress.  NECK:  Supple, no jugular venous distention. No thyroid enlargement, no tenderness.  LUNGS some mild bilateral rhonchi no wheezing, rales,rhonchi  No use of accessory muscles of respiration.  CARDIOVASCULAR: S1, S2 normal. No murmurs, rubs, or gallops.  ABDOMEN: Soft, non-tender, non-distended. Bowel sounds present. No organomegaly or mass.  EXTREMITIES: No pedal edema, cyanosis, or clubbing.  PSYCHIATRIC: The patient is alert and oriented x 3.  SKIN: No obvious rash, lesion, or ulcer.   DATA REVIEW:   CBC  Recent Labs Lab 08/22/16 0539  WBC 22.1*  HGB 13.0  HCT 38.7  PLT 273    Chemistries   Recent Labs Lab 08/22/16 0539  NA 138  K 4.6  CL 105  CO2 27  GLUCOSE 270*  BUN 20  CREATININE 0.99  CALCIUM 9.0    Cardiac Enzymes  Recent Labs Lab 08/21/16 0537  TROPONINI <0.03    Microbiology Results  @MICRORSLT48 @  RADIOLOGY:  Dg Chest Portable 1 View  Result Date: 08/21/2016 CLINICAL DATA:   Cough and shortness of breath since Thursday. Patient was seen here on that day and had a negative chest x-ray. EXAM: PORTABLE CHEST 1 VIEW COMPARISON:  08/16/2016 FINDINGS: The heart size and mediastinal contours are within normal limits. Both lungs are clear. The visualized skeletal structures are unremarkable. IMPRESSION: No active disease. Electronically Signed   By: Burman NievesWilliam  Stevens M.D.   On: 08/21/2016 06:02      Current Discharge Medication List    START taking these medications   Details  azithromycin (ZITHROMAX) 250 MG tablet Take 1 tablet dail Qty: 3 each, Refills: 0    benzonatate (TESSALON) 100 MG capsule Take 1 capsule (100 mg total) by mouth 3 (three) times daily as needed for cough. Qty: 20 capsule, Refills: 0    predniSONE (DELTASONE) 10 MG tablet Take 5 tablets (50 mg total) by mouth daily with breakfast. 50 mg daily for 4 days Qty: 20 tablet, Refills: 0      CONTINUE these medications which  have NOT CHANGED   Details  ADDERALL XR 30 MG 24 hr capsule Take 30 mg by mouth 2 (two) times daily. Am and 12 noon Refills: 0    albuterol (PROVENTIL HFA;VENTOLIN HFA) 108 (90 Base) MCG/ACT inhaler Inhale 2 puffs into the lungs every 6 (six) hours as needed for wheezing or shortness of breath. Qty: 1 Inhaler, Refills: 2    clonazePAM (KLONOPIN) 2 MG tablet Take 2 mg by mouth 2 (two) times daily. Refills: 2    glipiZIDE (GLUCOTROL) 5 MG tablet Take 1 tablet by mouth daily.  Refills: 6    LANTUS SOLOSTAR 100 UNIT/ML Solostar Pen Inject 40 Units as directed at bedtime. Refills: 6    LATUDA 40 MG TABS tablet Take 40 mg by mouth daily. Refills: 4    lisinopril (PRINIVIL,ZESTRIL) 10 MG tablet Take 10 mg by mouth daily. Refills: 5    meclizine (ANTIVERT) 25 MG tablet Take 1 tablet (25 mg total) by mouth 3 (three) times daily as needed for dizziness. Qty: 15 tablet, Refills: 0    metFORMIN (GLUCOPHAGE) 1000 MG tablet Take 1,000 mg by mouth 2 (two) times daily. Refills: 6     omeprazole (PRILOSEC) 20 MG capsule Take 20 mg by mouth daily. Refills: 5    ondansetron (ZOFRAN) 4 MG tablet Take 1 tablet (4 mg total) by mouth every 8 (eight) hours as needed for nausea or vomiting. Qty: 8 tablet, Refills: 0    rosuvastatin (CRESTOR) 10 MG tablet Take 10 mg by mouth daily.           Management plans discussed with the patient and she is in agreement. Stable for discharge home  Patient should follow up with pcp  CODE STATUS:     Code Status Orders        Start     Ordered   08/21/16 0853  Full code  Continuous     08/21/16 0852    Code Status History    Date Active Date Inactive Code Status Order ID Comments User Context   This patient has a current code status but no historical code status.      TOTAL TIME TAKING CARE OF THIS PATIENT: 37 minutes.    Note: This dictation was prepared with Dragon dictation along with smaller phrase technology. Any transcriptional errors that result from this process are unintentional.  Adonus Uselman M.D on 08/22/2016 at 8:24 AM  Between 7am to 6pm - Pager - [phone] After 6pm go to [email or web address] - Social research officer, government  [facility]  Office  [phone]  CC: Primary care physician; [facility]

## 2016-09-04 LAB — BLOOD GAS, VENOUS
ACID-BASE EXCESS: 2.2 mmol/L — AB (ref 0.0–2.0)
BICARBONATE: 29.1 mmol/L — AB (ref 20.0–28.0)
PATIENT TEMPERATURE: 37
PH VEN: 7.34 (ref 7.250–7.430)
pCO2, Ven: 54 mmHg (ref 44.0–60.0)

## 2017-05-30 ENCOUNTER — Emergency Department: Payer: No Typology Code available for payment source

## 2017-05-30 ENCOUNTER — Other Ambulatory Visit: Payer: Self-pay

## 2017-05-30 ENCOUNTER — Emergency Department
Admission: EM | Admit: 2017-05-30 | Discharge: 2017-05-30 | Disposition: A | Discharge status: Home / Self Care | Payer: No Typology Code available for payment source | Attending: Student in an Organized Health Care Education/Training Program | Admitting: Student in an Organized Health Care Education/Training Program

## 2017-05-30 DIAGNOSIS — Y999 Unspecified external cause status: Secondary | ICD-10-CM | POA: Insufficient documentation

## 2017-05-30 DIAGNOSIS — R0789 Other chest pain: Secondary | ICD-10-CM | POA: Diagnosis not present

## 2017-05-30 DIAGNOSIS — F17200 Nicotine dependence, unspecified, uncomplicated: Secondary | ICD-10-CM | POA: Diagnosis not present

## 2017-05-30 DIAGNOSIS — Y9389 Activity, other specified: Secondary | ICD-10-CM | POA: Diagnosis not present

## 2017-05-30 DIAGNOSIS — S299XXA Unspecified injury of thorax, initial encounter: Secondary | ICD-10-CM | POA: Diagnosis present

## 2017-05-30 DIAGNOSIS — Z79899 Other long term (current) drug therapy: Secondary | ICD-10-CM | POA: Insufficient documentation

## 2017-05-30 DIAGNOSIS — E119 Type 2 diabetes mellitus without complications: Secondary | ICD-10-CM | POA: Insufficient documentation

## 2017-05-30 DIAGNOSIS — Z794 Long term (current) use of insulin: Secondary | ICD-10-CM | POA: Insufficient documentation

## 2017-05-30 DIAGNOSIS — T07XXXA Unspecified multiple injuries, initial encounter: Secondary | ICD-10-CM

## 2017-05-30 DIAGNOSIS — S20319A Abrasion of unspecified front wall of thorax, initial encounter: Secondary | ICD-10-CM | POA: Diagnosis not present

## 2017-05-30 DIAGNOSIS — Y92410 Unspecified street and highway as the place of occurrence of the external cause: Secondary | ICD-10-CM | POA: Insufficient documentation

## 2017-05-30 DIAGNOSIS — I10 Essential (primary) hypertension: Secondary | ICD-10-CM | POA: Diagnosis not present

## 2017-05-30 MED ORDER — IBUPROFEN 600 MG PO TABS: 600.0000 mg | ORAL_TABLET | Freq: Three times a day (TID) | ORAL | 0 refills | Status: DC | PRN

## 2017-05-30 MED ORDER — HYDROCODONE-ACETAMINOPHEN 5-325 MG PO TABS: 1.0000 | ORAL_TABLET | Freq: Four times a day (QID) | ORAL | 0 refills | Status: DC | PRN
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
## 2017-08-26 ENCOUNTER — Encounter (INDEPENDENT_AMBULATORY_CARE_PROVIDER_SITE_OTHER): Payer: Self-pay

## 2017-08-26 ENCOUNTER — Ambulatory Visit: Payer: Medicaid Other | Admitting: Pharmacy Technician

## 2017-08-26 DIAGNOSIS — Z79899 Other long term (current) drug therapy: Secondary | ICD-10-CM

## 2017-08-28 NOTE — Progress Notes (Signed)
Patient has active Medicaid.  Patient will need to provide letter indicating Medicaid coverage has ended before additional medication assistance will be provided by [facility].  Sherilyn Dacosta Care Manager [facility]

## 2017-09-05 ENCOUNTER — Telehealth: Payer: Self-pay | Admitting: Pharmacy Technician

## 2017-09-05 ENCOUNTER — Other Ambulatory Visit: Payer: Self-pay

## 2017-09-05 ENCOUNTER — Encounter: Payer: Self-pay | Admitting: Pharmacist

## 2017-09-05 ENCOUNTER — Ambulatory Visit: Payer: Medicaid Other | Admitting: Pharmacist

## 2017-09-05 VITALS — BP 130/70 | Wt 239.0 lb

## 2017-09-05 DIAGNOSIS — Z79899 Other long term (current) drug therapy: Secondary | ICD-10-CM

## 2017-09-05 NOTE — Telephone Encounter (Signed)
Patient produced documentation that Medicaid would end on 09/14/17. Patient approved to receive medication assistance from [facility] through 2019, as long as eligibility criteria continues to be met.  [place] [facility]
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
## 2017-12-12 ENCOUNTER — Ambulatory Visit: Payer: Medicaid Other | Admitting: Pharmacist

## 2017-12-12 ENCOUNTER — Encounter: Payer: Self-pay | Admitting: Pharmacist

## 2017-12-12 ENCOUNTER — Encounter (INDEPENDENT_AMBULATORY_CARE_PROVIDER_SITE_OTHER): Payer: Self-pay

## 2017-12-12 VITALS — BP 110/78 | Ht 67.0 in | Wt 230.0 lb

## 2017-12-12 DIAGNOSIS — Z79899 Other long term (current) drug therapy: Secondary | ICD-10-CM

## 2017-12-12 NOTE — Progress Notes (Addendum)
Medication Management Clinic Visit Note  Patient: Angela Velasquez MRN: [id number] Date of Birth: May 23, 1975 PCP: Center, [facility]ine[facility] Real [facility] NESSIE NONG 42 y.o. female presents for an initial medication therapy management visit today.  BP 110/78 (BP Location: Right Arm, Patient Position: Sitting, Cuff Size: Large)   Ht 5\' 7"  (1.702 m)   Wt 230 lb (104.3 kg)   LMP  (LMP Unknown)   BMI 36.02 kg/m   Patient Information   Past Medical History:  Diagnosis Date  . Diabetes mellitus without complication [facility])       Past Surgical History:  Procedure Laterality Date  . TUBAL LIGATION       Family History  Problem Relation Age of Onset  . COPD Mother   . Cancer Maternal Grandmother   . Heart disease Maternal Grandmother     New Diagnoses (since last visit):   Family Support: Good  Lifestyle Diet: Breakfast: sugar free oatmeal, boiled eggs Lunch: peanut butter crackers Dinner: small portions because dinner is mostly carbohydrates Drinks: water, diet mountain dew 1-2x/day            Social History   Substance and Sexual Activity  Alcohol Use No      Social History   Tobacco Use  Smoking Status Current Every Day Smoker  . Packs/day: 1.00  . Years: 23.00  . Pack years: 23.00  Smokeless Tobacco Never Used  Tobacco Comment   Has tried Chantix, gum, patches, and hard candy but none of that has worked for her. Mentioned Nicotrol product to her today. would need script from doctor to fill whenever she is ready to quit      Health Maintenance  Topic Date Due  . TETANUS/TDAP  02/26/1993  . PAP SMEAR  02/27/1995  . INFLUENZA VACCINE  11/14/2017  . HIV Screening  Completed   Outpatient Encounter Medications as of 12/12/2017  Medication Sig  . ADDERALL XR 30 MG 24 hr capsule Take 30 mg by mouth 2 (two) times daily. Am and 12 noon  . albuterol (PROVENTIL HFA;VENTOLIN HFA) 108 (90 Base) MCG/ACT inhaler Inhale 2 puffs into the lungs every 6 (six)  hours as needed for wheezing or shortness of breath.  . clonazePAM (KLONOPIN) 2 MG tablet Take 2 mg by mouth 2 (two) times daily.  . empagliflozin (JARDIANCE) 25 MG TABS tablet Take 25 mg by mouth daily.  Marland Kitchen glipiZIDE (GLUCOTROL) 5 MG tablet Take by mouth daily before breakfast. Take 1 tablet every morning before breakfast  . LANTUS SOLOSTAR 100 UNIT/ML Solostar Pen Inject 50 Units as directed at bedtime.   Marland Kitchen LATUDA 40 MG TABS tablet Take 40 mg by mouth daily.  Marland Kitchen liraglutide (VICTOZA) 18 MG/3ML SOPN Inject 1.2 mg into the skin daily.  Marland Kitchen lisinopril (PRINIVIL,ZESTRIL) 10 MG tablet Take 10 mg by mouth daily.  . metFORMIN (GLUCOPHAGE) 1000 MG tablet Take 1,000 mg by mouth 2 (two) times daily.  Marland Kitchen omeprazole (PRILOSEC) 20 MG capsule Take 20 mg by mouth daily.  . rosuvastatin (CRESTOR) 10 MG tablet Take 10 mg by mouth daily.  . [DISCONTINUED] rosuvastatin (CRESTOR) 10 MG tablet Take 10 mg by mouth daily.   No facility-administered encounter medications on file as of 12/12/2017.    Health Maintenance/Date Completed  Last ED visit:  Last Visit to PCP:  Next Visit to PCP:  Specialist Visit:  Dental Exam:  Eye Exam:  Prostate Exam:  Pelvic/PAP Exam:  Mammogram:  DEXA:  Colonoscopy:  Flu Vaccine: Annually Pneumonia  Vaccine: None    Assessment and Plan:  Compliance: Patient is compliant with all her medications.   GERD: Currently taking omeprazole.  Diabetes: Currently taking metformin, glipizide, Jardiance, Lantus, and Victoza. A1c reading as of 06/27/17 is 8.1%. She was initiated on Victoza right before losing insurance and has not been able to follow up with endocrinologist (about 5 months ago). Patient states she experienced some appetite aversion with initiation of Victoza only. She does not feel like the Victoza is effective. Patient does not check blood sugar readings at home. Patient is working on diet and lifestyle changes. She tries to eat a sugar-free breakfast, regulates  portion sizes, and increases water intake throughout the day. Patient take lisinopril daily for kidney protection. Patient was counseled on different ways to incorporate exercise into her daily routine. Additionally, patient was encouraged to reach out to endocrinologist about titrating Victoza from her current dose of 1.2mg  daily to the maximum dose of 1.8mg  daily. She counseled on signs and symptoms of hypoglycemia and management due to multiple diabetes medications. Patient was advised to contact the MTM clinic if she faced difficulties with contacting the endocrinologist..  RTS: 6 months.  Debbrah Sampedro PharmD Candidate  Cosigned: Denice Paradisehristan Holt, PharmD, RPh [facility]) [phone]

## 2018-01-16 ENCOUNTER — Telehealth: Payer: Self-pay | Admitting: Pharmacist

## 2018-01-16 NOTE — Telephone Encounter (Signed)
01/16/2018 12:10:38 PM - Victoza pens & tips refill  01/16/18 Taking Novo Nordisk refill request to Dr. Verdis Frederickson @ [facility] Endocr[facility]ology to sign for Victoza Inject 1.2mg  once daily & Novofine 32G tips.Forde Radon

## 2018-02-11 ENCOUNTER — Emergency Department
Admission: EM | Admit: 2018-02-11 | Discharge: 2018-02-11 | Disposition: A | Discharge status: Home / Self Care | Payer: Medicaid Other | Attending: Emergency Medicine | Admitting: Emergency Medicine

## 2018-02-11 ENCOUNTER — Encounter: Payer: Self-pay | Admitting: Emergency Medicine

## 2018-02-11 ENCOUNTER — Other Ambulatory Visit: Payer: Self-pay

## 2018-02-11 DIAGNOSIS — E119 Type 2 diabetes mellitus without complications: Secondary | ICD-10-CM | POA: Insufficient documentation

## 2018-02-11 DIAGNOSIS — F17200 Nicotine dependence, unspecified, uncomplicated: Secondary | ICD-10-CM | POA: Insufficient documentation

## 2018-02-11 DIAGNOSIS — Z7984 Long term (current) use of oral hypoglycemic drugs: Secondary | ICD-10-CM | POA: Insufficient documentation

## 2018-02-11 DIAGNOSIS — M62830 Muscle spasm of back: Secondary | ICD-10-CM

## 2018-02-11 DIAGNOSIS — Z79899 Other long term (current) drug therapy: Secondary | ICD-10-CM | POA: Insufficient documentation

## 2018-02-11 MED ORDER — ORPHENADRINE CITRATE 30 MG/ML IJ SOLN
60.0000 mg | Freq: Two times a day (BID) | INTRAMUSCULAR | Status: DC
  Administered 2018-02-11: 60 mg via INTRAMUSCULAR
  Filled 2018-02-11: qty 2

## 2018-02-11 MED ORDER — OXYCODONE-ACETAMINOPHEN 7.5-325 MG PO TABS: 1.0000 | ORAL_TABLET | Freq: Four times a day (QID) | ORAL | 0 refills | Status: DC | PRN

## 2018-02-11 MED ORDER — KETOROLAC TROMETHAMINE 10 MG PO TABS: 10.0000 mg | ORAL_TABLET | Freq: Four times a day (QID) | ORAL | 0 refills | Status: DC | PRN

## 2018-02-11 MED ORDER — OXYCODONE-ACETAMINOPHEN 5-325 MG PO TABS
ORAL_TABLET | ORAL | Status: AC
  Filled 2018-02-11: qty 1

## 2018-02-11 MED ORDER — ONDANSETRON 8 MG PO TBDP
8.0000 mg | ORAL_TABLET | Freq: Once | ORAL | Status: AC
  Administered 2018-02-11: 8 mg via ORAL
  Filled 2018-02-11: qty 1

## 2018-02-11 MED ORDER — CYCLOBENZAPRINE HCL 10 MG PO TABS: 10.0000 mg | ORAL_TABLET | Freq: Three times a day (TID) | ORAL | 0 refills | Status: DC | PRN

## 2018-02-11 MED ORDER — KETOROLAC TROMETHAMINE 60 MG/2ML IM SOLN
60.0000 mg | Freq: Once | INTRAMUSCULAR | Status: AC
Start: 2018-02-11 — End: 2018-02-11
  Administered 2018-02-11: 60 mg via INTRAMUSCULAR
  Filled 2018-02-11: qty 2

## 2018-02-11 MED ORDER — OXYCODONE-ACETAMINOPHEN 5-325 MG PO TABS
1.0000 | ORAL_TABLET | ORAL | Status: DC | PRN
  Administered 2018-02-11: 1 via ORAL

## 2018-02-11 MED ORDER — HYDROMORPHONE HCL 1 MG/ML IJ SOLN
1.0000 mg | Freq: Once | INTRAMUSCULAR | Status: AC
  Administered 2018-02-11: 1 mg via INTRAMUSCULAR
  Filled 2018-02-11: qty 1

## 2018-02-11 NOTE — ED Triage Notes (Signed)
Patient reports acute back pain starting last night. Denies any injury. Patient reports nausea from pain. Patient states that pain worse with any sort of movement. Visibly uncomfortable in triage.

## 2018-02-11 NOTE — ED Notes (Signed)
See triage note  presents with lower back pain  Denies any injury but states pian started last pm became worse this am  Increased pain with movement

## 2018-02-11 NOTE — ED Provider Notes (Signed)
[facility] Emergency Department Provider Note   ____________________________________________   First MD Initiated Contact with Patient 02/11/18 [phone]     (approximate)  I have reviewed the triage vital signs and the nursing notes.   HISTORY  Chief Complaint Back Pain     HPI Angela Velasquez is a 42 y.o. female patient presents with non-provocative acute back pain.  Patient state pain started in the middle night and woke her from sleeping.  Patient states there is nausea from this pain.  Patient rates the pain as a 10/10.  Patient described the pain as sharp/achy.  Patient states similar complaint over a year ago was diagnosis severe muscle cramp.  Patient denies radicular component to her back pain.  Patient denies bladder bowel dysfunction.  No palliative measures for complaint.  Past Medical History:  Diagnosis Date  . Diabetes mellitus without complication [facility])     Patient Active Problem List   Diagnosis Date Noted  . Hypoxia 08/21/2016    Past Surgical History:  Procedure Laterality Date  . TUBAL LIGATION      Prior to Admission medications   Medication Sig Start Date End Date Taking? Authorizing Provider  ADDERALL XR 30 MG 24 hr capsule Take 30 mg by mouth 2 (two) times daily. Am and 12 noon 08/08/16   [provider]  albuterol (PROVENTIL HFA;VENTOLIN HFA) 108 (90 Base) MCG/ACT inhaler Inhale 2 puffs into the lungs every 6 (six) hours as needed for wheezing or shortness of breath. 08/16/16   Jeanmarie Plant, MD  clonazePAM (KLONOPIN) 2 MG tablet Take 2 mg by mouth 2 (two) times daily. 07/26/16   [provider]  cyclobenzaprine (FLEXERIL) 10 MG tablet Take 1 tablet (10 mg total) by mouth 3 (three) times daily as needed. 02/11/18   Joni Reining, PA-C  empagliflozin (JARDIANCE) 25 MG TABS tablet Take 25 mg by mouth daily.    [provider]  glipiZIDE (GLUCOTROL) 5 MG tablet Take by mouth daily before breakfast. Take  1 tablet every morning before breakfast    [provider]  ketorolac (TORADOL) 10 MG tablet Take 1 tablet (10 mg total) by mouth every 6 (six) hours as needed. 02/11/18   Joni Reining, PA-C  LANTUS SOLOSTAR 100 UNIT/ML Solostar Pen Inject 50 Units as directed at bedtime.  07/11/16   [provider]  LATUDA 40 MG TABS tablet Take 40 mg by mouth daily. 08/01/16   [provider]  liraglutide (VICTOZA) 18 MG/3ML SOPN Inject 1.2 mg into the skin daily.    [provider]  lisinopril (PRINIVIL,ZESTRIL) 10 MG tablet Take 10 mg by mouth daily. 07/11/16   [provider]  metFORMIN (GLUCOPHAGE) 1000 MG tablet Take 1,000 mg by mouth 2 (two) times daily. 07/28/16   [provider]  omeprazole (PRILOSEC) 20 MG capsule Take 20 mg by mouth daily. 07/11/16   [provider]  oxyCODONE-acetaminophen (PERCOCET) 7.5-325 MG tablet Take 1 tablet by mouth every 6 (six) hours as needed for severe pain. 02/11/18   Joni Reining, PA-C  rosuvastatin (CRESTOR) 10 MG tablet Take 10 mg by mouth daily.    [provider]    Allergies Patient has no known allergies.  Family History  Problem Relation Age of Onset  . COPD Mother   . Cancer Maternal Grandmother   . Heart disease Maternal Grandmother     Social History Social History   Tobacco Use  . Smoking status: Current Every  Day Smoker    Packs/day: 1.00    Years: 23.00    Pack years: 23.00  . Smokeless tobacco: Never Used  . Tobacco comment: Has tried Chantix, gum, patches, and hard candy but none of that has worked for her. Mentioned Nicotrol product to her today. would need script from doctor to fill whenever she is ready to quit  Substance Use Topics  . Alcohol use: No  . Drug use: Yes    Types: Cocaine    Comment: Sober x 13 years    Review of Systems  Constitutional: No fever/chills Eyes: No visual changes. ENT: No sore throat. Cardiovascular: Denies chest  pain. Respiratory: Denies shortness of breath. Gastrointestinal: No abdominal pain.  No nausea, no vomiting.  No diarrhea.  No constipation. Genitourinary: Negative for dysuria. Musculoskeletal: Negative for back pain. Skin: Negative for rash. Neurological: Negative for headaches, focal weakness or numbness. Endocrine:Diabetes and hypertension.   ____________________________________________   PHYSICAL EXAM:  VITAL SIGNS: ED Triage Vitals  Enc Vitals Group     BP 02/11/18 0850 (!) 152/85     Pulse Rate 02/11/18 0850 95     Resp 02/11/18 0850 20     Temp 02/11/18 0850 (!) 97.5 F (36.4 C)     Temp Source 02/11/18 0850 Oral     SpO2 02/11/18 0850 97 %     Weight 02/11/18 0850 240 lb (108.9 kg)     Height 02/11/18 0850 5\' 7"  (1.702 m)     Head Circumference --      Peak Flow --      Pain Score 02/11/18 0855 10     Pain Loc --      Pain Edu? --      Excl. in GC? --    Constitutional: Alert and oriented.  Moderate distress.  Morbid obesity. Neck: No stridor.  Hematological/Lymphatic/Immunilogical No cervical lymphadenopathy. Cardiovascular: Normal rate, regular rhythm. Grossly normal heart sounds.  Good peripheral circulation. Respiratory: Normal respiratory effort.  No retractions. Lungs CTAB. Gastrointestinal: Soft and nontender. No distention. No abdominal bruits. No CVA tenderness. Musculoskeletal: No obvious spinal deformity.  Patient decreased range of motion with lateral movements.  Patient has remarkable paraspinal muscle spasms.  Patient has negative straight leg test. Neurologic:  Normal speech and language. No gross focal neurologic deficits are appreciated. No gait instability. Skin:  Skin is warm, dry and intact. No rash noted. Psychiatric: Mood and affect are normal. Speech and behavior are normal.  ____________________________________________   LABS (all labs ordered are listed, but only abnormal results are displayed)  Labs Reviewed - No data to  display ____________________________________________  EKG   ____________________________________________  RADIOLOGY  ED MD interpretation:    Official radiology report(s): No results found.  ____________________________________________   PROCEDURES  Procedure(s) performed: None  Procedures  Critical Care performed: No  ____________________________________________   INITIAL IMPRESSION / ASSESSMENT AND PLAN / ED COURSE  As part of my medical decision making, I reviewed the following data within the electronic MEDICAL RECORD NUMBER    Patient presents with non-provocative acute back pain which started last night.  Physical exam is consistent of acute muscle spasms of the back.  Patient received remarkable relief status post Norflex, Toradol, Dilaudid.  Patient given discharge care instruction advised take medication directed.  Patient advised follow-up PCP as needed.     ____________________________________________   FINAL CLINICAL IMPRESSION(S) / ED DIAGNOSES  Final diagnoses:  Muscle spasm of back     ED Discharge Orders  Ordered    cyclobenzaprine (FLEXERIL) 10 MG tablet  3 times daily PRN     02/11/18 1050    oxyCODONE-acetaminophen (PERCOCET) 7.5-325 MG tablet  Every 6 hours PRN     02/11/18 1050    ketorolac (TORADOL) 10 MG tablet  Every 6 hours PRN     02/11/18 1050           Note:  This document was prepared using Dragon voice recognition software and may include unintentional dictation errors.    Joni Reining, PA-C 02/11/18 1058    Rockne Menghini, MD 02/11/18 1128

## 2018-02-11 NOTE — Discharge Instructions (Signed)
Take medication as directed.  Be advised this medication may cause drowsiness.

## 2018-03-28 ENCOUNTER — Telehealth: Payer: Self-pay | Admitting: Pharmacist

## 2018-03-28 NOTE — Telephone Encounter (Signed)
03/28/2018 10:43:27 AM - LANTUS VIALS REFILL  03/28/18 Faxed [employer] refill request for Lantus Vials Inject 50 units daily at bedtime #5.Forde RadonAJ

## 2018-05-22 ENCOUNTER — Telehealth: Payer: Self-pay | Admitting: Pharmacist

## 2018-05-22 NOTE — Telephone Encounter (Signed)
05/22/2018 2:17:17 PM - Lantus Vials refill  05/22/2018 Printed [employer] refill request for Lantus vials Inject 50 units daily at bedtime, taking to Dr. Gershon Crane @ [facility] Endocrinology department to sign.Forde Radon

## 2018-06-04 ENCOUNTER — Telehealth: Payer: Self-pay | Admitting: Pharmacist

## 2018-06-04 NOTE — Telephone Encounter (Signed)
06/04/2018 4:02:41 PM - Lantus Vials refill  06/04/2018 Faxed [employer] refill request for Lantus Vials Inject 50 units daily at bedtime #5.AJ   06/04/2018 4:02:04 PM - Victoza & tips refill  06/04/2018 Faxed Novo Nordisk refill request for Victoza Inject 1.2 mg once daily & Novofine 32G tips.Forde Radon

## 2018-06-19 ENCOUNTER — Encounter: Payer: Self-pay | Admitting: Pharmacist

## 2018-06-19 ENCOUNTER — Encounter (INDEPENDENT_AMBULATORY_CARE_PROVIDER_SITE_OTHER): Payer: Self-pay

## 2018-06-19 ENCOUNTER — Ambulatory Visit: Payer: Medicaid Other | Admitting: Pharmacist

## 2018-06-19 ENCOUNTER — Other Ambulatory Visit: Payer: Self-pay

## 2018-06-19 VITALS — BP 118/78 | Ht 67.0 in | Wt 246.0 lb

## 2018-06-19 DIAGNOSIS — Z79899 Other long term (current) drug therapy: Secondary | ICD-10-CM

## 2018-06-19 NOTE — Progress Notes (Signed)
Medication Management Clinic Visit Note  Patient: Angela Velasquez MRN: [id number] Date of Birth: [birth year] PCP: [facility], [facility]ineas Real [facility] CAMERIN EWAN 43 y.o. female presents for a f/u MTM visit today.  BP 118/78 (BP Location: Right Arm, Patient Position: Sitting, Cuff Size: Large)   Ht 5\' 7"  (1.702 m)   Wt 246 lb (111.6 kg)   LMP  (LMP Unknown)   BMI 38.53 kg/m   Patient Information   Past Medical History:  Diagnosis Date  . Diabetes mellitus without complication [facility])       Past Surgical History:  Procedure Laterality Date  . TUBAL LIGATION       Family History  Problem Relation Age of Onset  . COPD Mother   . Cancer Maternal Grandmother   . Heart disease Maternal Grandmother     New Diagnoses (since last visit): n/a  Family Support: lives with 2 dtr and  dtr husband           Social History   Substance and Sexual Activity  Alcohol Use No      Social History   Tobacco Use  Smoking Status Current Every Day Smoker  . Packs/day: 1.00  . Years: 23.00  . Pack years: 23.00  Smokeless Tobacco Never Used  Tobacco Comment   Has tried Chantix, gum, patches, and hard candy but none of that has worked for her. Mentioned Nicotrol product to her today. would need script from doctor to fill whenever she is ready to quit      Health Maintenance  Topic Date Due  . TETANUS/TDAP  02/26/1993  . PAP SMEAR-Modifier  02/27/1995  . INFLUENZA VACCINE  11/14/2017  . HIV Screening  Completed   Outpatient Encounter Medications as of 06/19/2018  Medication Sig  . ADDERALL XR 30 MG 24 hr capsule Take 30 mg by mouth 2 (two) times daily. Am and 12 noon  . albuterol (PROVENTIL HFA;VENTOLIN HFA) 108 (90 Base) MCG/ACT inhaler Inhale 2 puffs into the lungs every 6 (six) hours as needed for wheezing or shortness of breath.  . clonazePAM (KLONOPIN) 2 MG tablet Take 2 mg by mouth 2 (two) times daily.  Marland Kitchen LANTUS SOLOSTAR 100 UNIT/ML Solostar Pen Inject 50 Units as  directed at bedtime.   Marland Kitchen LATUDA 40 MG TABS tablet Take 40 mg by mouth daily.  Marland Kitchen liraglutide (VICTOZA) 18 MG/3ML SOPN Inject 1.2 mg into the skin daily.  Marland Kitchen lisinopril (PRINIVIL,ZESTRIL) 10 MG tablet Take 10 mg by mouth daily.  Marland Kitchen omeprazole (PRILOSEC) 20 MG capsule Take 20 mg by mouth daily.  . rosuvastatin (CRESTOR) 10 MG tablet Take 10 mg by mouth daily.  . [DISCONTINUED] cyclobenzaprine (FLEXERIL) 10 MG tablet Take 1 tablet (10 mg total) by mouth 3 (three) times daily as needed.  . [DISCONTINUED] empagliflozin (JARDIANCE) 25 MG TABS tablet Take 25 mg by mouth daily.  . [DISCONTINUED] glipiZIDE (GLUCOTROL) 5 MG tablet Take by mouth daily before breakfast. Take 1 tablet every morning before breakfast  . [DISCONTINUED] ketorolac (TORADOL) 10 MG tablet Take 1 tablet (10 mg total) by mouth every 6 (six) hours as needed.  . [DISCONTINUED] metFORMIN (GLUCOPHAGE) 1000 MG tablet Take 1,000 mg by mouth 2 (two) times daily.  . [DISCONTINUED] oxyCODONE-acetaminophen (PERCOCET) 7.5-325 MG tablet Take 1 tablet by mouth every 6 (six) hours as needed for severe pain.   No facility-administered encounter medications on file as of 06/19/2018.     Assessment and Plan:  Compliance: pt knows names, frequencies, and  indications of all her medications with no prompting. States she uses her inhaler only when needed about 2x/mo.   DM:  Ms. Gillock states that she had a recent A1c at [facility] drew but she did not know the value and I could not access it. She said that she knew it was high. When asked she told me that she does not check her blood sugars since she already knows they will be high. I asked if she had access to a meter and strips. She says that she has them but amitted she does not use them. We discussed the adverse outcomes from high blood sugar, pt is aware. Pt hjas been changed to Janumet 50/1000 BID and is tolerating it well. She states that her Lantus was increased to 60 units, Vicotza 1.2mg  daily, on ACE  and statin.  Depression/ADHD: she told me that she may need to speak to her mental health provider to consider changing her meds. She states that she is going to bed every evening when she comes home from work. She says she knows she needs to be more active, check BG, and work on her diet but feels it is difficult. We discussed talking with her PCP about a referral to a dietician to help with her diet and she stated she was interested and would ask about this. She has an appt coming up in April with [facility] provider and I encouraged her to speak with provider and advocate for herself to change meds. She states she does not always take the Adderal. When asked she says it does not interefer with sleep, yet she admits to taking 2 clonazepam at night. She also mentioned energy drinks and I cautioned her to be careful with caffeinated beverages.  RTC: 61mos  Denice Paradise, PharmD, RPh [facility]) [phone]

## 2018-10-09 ENCOUNTER — Telehealth: Payer: Self-pay | Admitting: Pharmacy Technician

## 2018-10-09 NOTE — Telephone Encounter (Signed)
Received 2020 proof of income.  Patient eligible to receive medication assistance at [facility] as long as eligibility requirements continue to be met.  [place] [facility]

## 2018-12-10 ENCOUNTER — Other Ambulatory Visit: Payer: Self-pay

## 2018-12-10 ENCOUNTER — Ambulatory Visit: Payer: Medicaid Other | Admitting: Pharmacist

## 2018-12-10 ENCOUNTER — Encounter: Payer: Self-pay | Admitting: Pharmacist

## 2018-12-10 NOTE — Progress Notes (Signed)
Medication Management Clinic Visit Note  Patient: Angela Velasquez MRN: [id number] Date of Birth: [birth year] PCP: [facility], [place] 43 y.o. female was contacted via telephone for annual MTM. She was identified with DOB and address.    Patient Information   Past Medical History:  Diagnosis Date  . Diabetes mellitus without complication [facility])       Past Surgical History:  Procedure Laterality Date  . TUBAL LIGATION       Family History  Problem Relation Age of Onset  . COPD Mother   . Cancer Maternal Grandmother   . Heart disease Maternal Grandmother     New Diagnoses (since last visit):   Family Support: Good  Lifestyle Diet: Breakfast: Sugar free oatmeal Lunch: sandwiches, but starting soon to substitute with PB and celery or apples Dinner: Education officer, environmental Drinks: water  Exercise: Patient does not exercise          Social History   Substance and Sexual Activity  Alcohol Use Yes   Comment: 1-2 a month      Social History   Tobacco Use  Smoking Status Current Every Day Smoker  . Packs/day: 1.00  . Years: 23.00  . Pack years: 23.00  . Types: Cigarettes  Smokeless Tobacco Never Used  Tobacco Comment   Has tried Chantix, gum, patches, and hard candy but none of that has worked for her. Mentioned Nicotrol product to her today. would need script from doctor to fill whenever she is ready to quit      Health Maintenance  Topic Date Due  . TETANUS/TDAP  02/26/1993  . PAP SMEAR-Modifier  02/27/1995  . INFLUENZA VACCINE  11/15/2018  . HIV Screening  Completed   Outpatient Encounter Medications as of 12/10/2018  Medication Sig  . ADDERALL XR 30 MG 24 hr capsule Take 30 mg by mouth daily.   . clonazePAM (KLONOPIN) 2 MG tablet Take 2 mg by mouth at bedtime.   . cyclobenzaprine (FLEXERIL) 10 MG tablet Take 10 mg by mouth at bedtime as needed for muscle spasms.  Marland Kitchen LANTUS SOLOSTAR 100 UNIT/ML Solostar Pen  Inject 50 Units as directed at bedtime.   Marland Kitchen LATUDA 40 MG TABS tablet Take 40 mg by mouth daily.  Marland Kitchen liraglutide (VICTOZA) 18 MG/3ML SOPN Inject 1.2 mg into the skin daily.  Marland Kitchen lisinopril (PRINIVIL,ZESTRIL) 10 MG tablet Take 10 mg by mouth daily.  Marland Kitchen omeprazole (PRILOSEC) 20 MG capsule Take 20 mg by mouth daily.  . rosuvastatin (CRESTOR) 10 MG tablet Take 10 mg by mouth daily.  . sitaGLIPtin-metformin (JANUMET) 50-1000 MG tablet Take 1 tablet by mouth 2 (two) times daily with a meal.  . [DISCONTINUED] albuterol (PROVENTIL HFA;VENTOLIN HFA) 108 (90 Base) MCG/ACT inhaler Inhale 2 puffs into the lungs every 6 (six) hours as needed for wheezing or shortness of breath.   No facility-administered encounter medications on file as of 12/10/2018.      Assessment and Plan:  Adherence/Access: Patient obtains medication from [facility] with the exception of Adderall and clonazepam. Patient reports no issues with medications and occasionally miss 1-2 dose a month, if any.  DM: Patient takes lantus, liraglutide, and janumet; as patient reports recent BG checks as she just obtained a new meter. BG ranges in the 200s. Patient states that it is high and wanting nutritional help to combat it. She got few suggestion from doctor on adding in celery, apples, and peanut butter. Patient states that carbs are probably  the main culprit of her high BGs. She is trying to make changes. Suggested to patient that she can start gradually by reducing the carb intake. Patient is also on ACEi and ARB.   Acid reflux: patient takes omeprazole with no issues.  Mood/Anxiety: patient is on latuda and clonazepam with no issues.   Smoking Cessation: patient is not ready to quit as she is wanting to focus first on her DM and diet first. Discuss with patient Quitline [place] resources.   Haydee MonicaQuyen Shakeia Krus, PharmD Candidate [phone] [employer]

## 2018-12-11 ENCOUNTER — Telehealth: Payer: Self-pay | Admitting: Pharmacy Technician

## 2018-12-11 NOTE — Telephone Encounter (Signed)
Provided patient with information about [facility]

## 2018-12-25 ENCOUNTER — Encounter: Payer: Medicaid Other | Admitting: Pharmacist

## 2019-04-13 ENCOUNTER — Ambulatory Visit: Payer: Medicaid Other

## 2019-04-13 ENCOUNTER — Encounter (INDEPENDENT_AMBULATORY_CARE_PROVIDER_SITE_OTHER): Payer: Self-pay

## 2019-04-13 ENCOUNTER — Other Ambulatory Visit: Payer: Self-pay

## 2019-04-13 DIAGNOSIS — Z79899 Other long term (current) drug therapy: Secondary | ICD-10-CM

## 2019-04-13 NOTE — Progress Notes (Signed)
[facility] Visit Note  Patient: Angela Velasquez MRN: [id number] Date of Birth: [birth year] PCP: [facility] KALANI BARAY 43 y.o. female presents for a telephone medication management visit with the pharmacist today.  Patient Information   Past Medical History:  Diagnosis Date  . Diabetes mellitus without complication [facility])       Past Surgical History:  Procedure Laterality Date  . TUBAL LIGATION       Family History  Problem Relation Age of Onset  . COPD Mother   . Cancer Maternal Grandmother   . Heart disease Maternal Grandmother     New Diagnoses (since last visit): None  Lifestyle Diet: Breakfast:Nothing Lunch:Ham and a roll Dinner:Steak sandwich Drinks:Water. Occasionally sweet tea Desserts: Candy, cake, whatever is around Fast food: 1-2x/week    Current Exercise Habits: The patient does not participate in regular exercise at present  Exercise limited by: Other - see comments(Gym membership but un-able to go since COVID-19. Some MSK pain (knee pain). Limited by motivation.)    Social History   Substance and Sexual Activity  Alcohol Use Yes   Comment: 1-2 a month      Social History   Tobacco Use  Smoking Status Current Every Day Smoker  . Packs/day: 1.00  . Years: 23.00  . Pack years: 23.00  . Types: Cigarettes  Smokeless Tobacco Never Used  Tobacco Comment   Has tried Chantix, gum, patches, and hard candy but none of that has worked for her. Mentioned Nicotrol product to her today. would need script from doctor to fill whenever she is ready to quit      Health Maintenance  Topic Date Due  . TETANUS/TDAP  02/26/1993  . PAP SMEAR-Modifier  02/27/1995  . INFLUENZA VACCINE  11/15/2018  . HIV Screening  Completed    Current Outpatient Medications:  .  ADDERALL XR 30 MG 24 hr capsule, Take 30 mg by mouth daily. , Disp: , Rfl: 0 .  clonazePAM (KLONOPIN) 2 MG tablet, Take 2 mg by mouth at bedtime.  , Disp: , Rfl: 2 .  cyclobenzaprine (FLEXERIL) 10 MG tablet, Take 10 mg by mouth at bedtime as needed for muscle spasms., Disp: , Rfl:  .  LANTUS SOLOSTAR 100 UNIT/ML Solostar Pen, Inject 50 Units as directed at bedtime. , Disp: , Rfl: 6 .  LATUDA 40 MG TABS tablet, Take 40 mg by mouth daily., Disp: , Rfl: 4 .  liraglutide (VICTOZA) 18 MG/3ML SOPN, Inject 1.2 mg into the skin daily., Disp: , Rfl:  .  lisinopril (PRINIVIL,ZESTRIL) 10 MG tablet, Take 10 mg by mouth daily., Disp: , Rfl: 5 .  omeprazole (PRILOSEC) 20 MG capsule, Take 20 mg by mouth daily., Disp: , Rfl: 5 .  rosuvastatin (CRESTOR) 10 MG tablet, Take 10 mg by mouth daily., Disp: , Rfl:  .  sitaGLIPtin-metformin (JANUMET) 50-1000 MG tablet, Take 1 tablet by mouth 2 (two) times daily with a meal., Disp: , Rfl:    Health Maintenance/Date Completed  Last ED visit: 02/11/2018 (back pain) Last Visit to PCP: Approximately 4 months ago per patient Next Visit to PCP: Un-known Eye Exam: Has not been since losing Medicaid a couple of years ago Pelvic/PAP Exam: Receives with Mammogram: Manual exam at annual physical DEXA: Typically recommended for those >67 y/o Colonoscopy: Has not received. No family history of colon cancer. Flu Vaccine: No. Financial barrier Pneumonia Vaccine: No  Assessment and Plan:  Adherence: Patient able to list all medications  she is taking. She reports overall good adherence to medications but does forget to take them from time to time. She reports tolerating medications well with no adverse side effects.  Type 2 Diabetes Mellitus, un-controlled: Last available HgbA1c in chart was 8.1 in 06/2017. Patient reports last HgbA1c was taken approximately 4 months ago at last PCP visit and was over 10 which reflects worsening glucose control. Antidiabetic regimen includes sitagliptin-metformin (max dose), insulin glargine 60 units daily at bedtime, and liraglutide 1.2 mg daily. Patient endorses checking blood sugars 1-2x  per week either in the morning (fasting) or in the evening (~1 hour after dinner). Fasting blood sugars are in the 200s and evening post-prandial blood sugars exceed 300. She denies any signs or symptoms of hypoglycemia. She is on lisinopril for renal protection. She is on rosuvastatin for ASCVD prevention. Advised patient to reduce sweets and to attempt to increase exercise capacity as able.   Depression/ADHD: Patient takes Adderall, clonazepam, and lurasidone.   GERD: omeprazole. Un-clear how long she has been on acid suppression. Consideration should be given to trial off of PPI due to risk for osteoporosis, vitamin/mineral deficiencies, increased risk of C Diff/pneumonia.   RTC: 26mos  Laquitta Dominski Pharmacy Resident 13 April 2019

## 2019-04-18 ENCOUNTER — Encounter: Payer: Self-pay | Admitting: Emergency Medicine

## 2019-04-18 ENCOUNTER — Emergency Department: Payer: Medicaid Other

## 2019-04-18 ENCOUNTER — Other Ambulatory Visit: Payer: Self-pay

## 2019-04-18 ENCOUNTER — Emergency Department
Admission: EM | Admit: 2019-04-18 | Discharge: 2019-04-18 | Disposition: A | Discharge status: Home / Self Care | Payer: Medicaid Other | Attending: Emergency Medicine | Admitting: Emergency Medicine

## 2019-04-18 DIAGNOSIS — Y939 Activity, unspecified: Secondary | ICD-10-CM | POA: Insufficient documentation

## 2019-04-18 DIAGNOSIS — S62102A Fracture of unspecified carpal bone, left wrist, initial encounter for closed fracture: Secondary | ICD-10-CM

## 2019-04-18 DIAGNOSIS — Y999 Unspecified external cause status: Secondary | ICD-10-CM | POA: Insufficient documentation

## 2019-04-18 DIAGNOSIS — Z794 Long term (current) use of insulin: Secondary | ICD-10-CM | POA: Insufficient documentation

## 2019-04-18 DIAGNOSIS — W010XXA Fall on same level from slipping, tripping and stumbling without subsequent striking against object, initial encounter: Secondary | ICD-10-CM | POA: Insufficient documentation

## 2019-04-18 DIAGNOSIS — F17210 Nicotine dependence, cigarettes, uncomplicated: Secondary | ICD-10-CM | POA: Insufficient documentation

## 2019-04-18 DIAGNOSIS — E119 Type 2 diabetes mellitus without complications: Secondary | ICD-10-CM | POA: Insufficient documentation

## 2019-04-18 DIAGNOSIS — Y929 Unspecified place or not applicable: Secondary | ICD-10-CM | POA: Insufficient documentation

## 2019-04-18 DIAGNOSIS — S52592A Other fractures of lower end of left radius, initial encounter for closed fracture: Secondary | ICD-10-CM | POA: Insufficient documentation

## 2019-04-18 DIAGNOSIS — Z79899 Other long term (current) drug therapy: Secondary | ICD-10-CM | POA: Insufficient documentation

## 2019-04-18 MED ORDER — ONDANSETRON 4 MG PO TBDP
4.0000 mg | ORAL_TABLET | Freq: Once | ORAL | Status: AC
  Administered 2019-04-18: 4 mg via ORAL
  Filled 2019-04-18: qty 1

## 2019-04-18 MED ORDER — OXYCODONE-ACETAMINOPHEN 5-325 MG PO TABS: 1.0000 | ORAL_TABLET | Freq: Three times a day (TID) | ORAL | 0 refills | Status: AC | PRN

## 2019-04-18 MED ORDER — OXYCODONE-ACETAMINOPHEN 5-325 MG PO TABS
1.0000 | ORAL_TABLET | Freq: Once | ORAL | Status: AC
  Administered 2019-04-18: 14:00:00 1 via ORAL
  Filled 2019-04-18: qty 1

## 2019-04-18 NOTE — Discharge Instructions (Addendum)
Your exam and XR confirm a comminuted fracture of the left wrist. Wear the splint and keep it clean. Take the pain medicine as needed. Call Dr. Odis Luster to schedule and office visit.

## 2019-04-18 NOTE — ED Triage Notes (Signed)
Slipped and fell one hour ago, pain L wrist. CSM intact.

## 2019-04-18 NOTE — ED Provider Notes (Signed)
[facility] Emergency Department Provider Note ____________________________________________  Time seen: 1345  I have reviewed the triage vital signs and the nursing notes.  HISTORY  Chief Complaint  Wrist Injury  HPI Angela Velasquez is a 44 y.o. female presents to the ED for evaluation of acute left wrist pain and disability.  Patient describes a backwards mechanical fall with an outstretched  hand.  She described immediately spearing seeing pain and disability in the left hand patient presents none denies any other injury at this time.  Past Medical History:  Diagnosis Date  . Diabetes mellitus without complication [facility])     Patient Active Problem List   Diagnosis Date Noted  . Hypoxia 08/21/2016    Past Surgical History:  Procedure Laterality Date  . TUBAL LIGATION      Prior to Admission medications   Medication Sig Start Date End Date Taking? Authorizing Provider  ADDERALL XR 30 MG 24 hr capsule Take 30 mg by mouth daily.  08/08/16   [provider]  clonazePAM (KLONOPIN) 2 MG tablet Take 2 mg by mouth at bedtime.  07/26/16   [provider]  cyclobenzaprine (FLEXERIL) 10 MG tablet Take 10 mg by mouth at bedtime as needed for muscle spasms.    [provider]  LANTUS SOLOSTAR 100 UNIT/ML Solostar Pen Inject 50 Units as directed at bedtime.  07/11/16   [provider]  LATUDA 40 MG TABS tablet Take 40 mg by mouth daily. 08/01/16   [provider]  liraglutide (VICTOZA) 18 MG/3ML SOPN Inject 1.2 mg into the skin daily.    [provider]  lisinopril (PRINIVIL,ZESTRIL) 10 MG tablet Take 10 mg by mouth daily. 07/11/16   [provider]  omeprazole (PRILOSEC) 20 MG capsule Take 20 mg by mouth daily. 07/11/16   [provider]  oxyCODONE-acetaminophen (PERCOCET) 5-325 MG tablet Take 1 tablet by mouth every 8 (eight) hours as needed for up to 7 days for severe pain. 04/18/19 04/25/19  Jquan Egelston,  Charlesetta Ivory, PA-C  rosuvastatin (CRESTOR) 10 MG tablet Take 10 mg by mouth daily.    [provider]  sitaGLIPtin-metformin (JANUMET) 50-1000 MG tablet Take 1 tablet by mouth 2 (two) times daily with a meal.    [provider]    Allergies Patient has no known allergies.  Family History  Problem Relation Age of Onset  . COPD Mother   . Cancer Maternal Grandmother   . Heart disease Maternal Grandmother     Social History Social History   Tobacco Use  . Smoking status: Current Every Day Smoker    Packs/day: 1.00    Years: 23.00    Pack years: 23.00    Types: Cigarettes  . Smokeless tobacco: Never Used  . Tobacco comment: Has tried Chantix, gum, patches, and hard candy but none of that has worked for her. Mentioned Nicotrol product to her today. would need script from doctor to fill whenever she is ready to quit  Substance Use Topics  . Alcohol use: Yes    Comment: 1-2 a month  . Drug use: Not Currently    Types: Cocaine    Comment: Sober x 13 years    Review of Systems  Constitutional: Negative for fever. Eyes: Negative for visual changes. ENT: Negative for sore throat. Cardiovascular: Negative for chest pain. Respiratory: Negative for shortness of breath. Gastrointestinal: Negative for abdominal pain, vomiting and diarrhea. Genitourinary: Negative for dysuria. Musculoskeletal: Negative for back pain.  Left wrist pain  and disability as above. Skin: Negative for rash. Neurological: Negative for headaches, focal weakness or numbness. ____________________________________________  PHYSICAL EXAM:  VITAL SIGNS: ED Triage Vitals  Enc Vitals Group     BP 04/18/19 1218 (!) 143/99     Pulse Rate 04/18/19 1218 (!) 102     Resp 04/18/19 1218 20     Temp 04/18/19 1218 98.1 F (36.7 C)     Temp Source 04/18/19 1218 Oral     SpO2 04/18/19 1218 97 %     Weight 04/18/19 1219 247 lb (112 kg)     Height 04/18/19 1219 5\' 7"  (1.702 m)     Head Circumference  --      Peak Flow --      Pain Score 04/18/19 1219 10     Pain Loc --      Pain Edu? --      Excl. in GC? --     Constitutional: Alert and oriented. Well appearing and in no distress. Head: Normocephalic and atraumatic. Eyes: Conjunctivae are normal.  Normal extraocular movements Neck: Supple. No thyromegaly. Cardiovascular: Normal rate, regular rhythm. Normal distal pulses. Respiratory: Normal respiratory effort. No wheezes/rales/rhonchi. Gastrointestinal: Soft and nontender. No distention. Musculoskeletal: Left wrist with dorsal deformity noted locally.  Patient with normal composite fist.  Nontender with normal range of motion in all extremities.  Neurologic:  Normal gross sensation. Normal speech and language. No gross focal neurologic deficits are appreciated. Skin:  Skin is warm, dry and intact. No rash noted. ____________________________________________   RADIOLOGY  DG Left Wrist IMPRESSION: Mildly comminuted fracture in the distal left radius.  I, 06/16/19, personally viewed and evaluated these images (plain radiographs) as part of my medical decision making, as well as reviewing the written report by the radiologist. ____________________________________________  PROCEDURES  Percocet 5-325 mg PO Zofran 4 mg ODT  .Splint Application  Date/Time: 04/18/2019 2:15 PM Performed by: 06/16/2019, NT Authorized by: Jacqlyn Larsen, PA-C   Consent:    Consent obtained:  Verbal   Consent given by:  Patient   Risks discussed:  Pain Pre-procedure details:    Sensation:  Normal Procedure details:    Laterality:  Left   Location:  Wrist   Wrist:  L wrist   Splint type:  Sugar tong   Supplies:  Elastic bandage, cotton padding, Ortho-Glass and sling Post-procedure details:    Pain:  Improved   Sensation:  Normal   Patient tolerance of procedure:  Tolerated well, no immediate  complications   ____________________________________________  INITIAL IMPRESSION / ASSESSMENT AND PLAN / ED COURSE  ----------------------------------------- 2:04 PM on 04/18/2019 ----------------------------------------- S/W Dr. 06/16/2019. He will see the patient in the office next week.    Patient with ED evaluation initial fracture management of a close comminuted fracture of the left wrist.  She is placed in appropriate sugar tong splint and a sling for comfort.  Prescription for oxycodone was provided for her benefit.  She will follow-up with orthopedics for further fracture care.  A work note is provided limiting her use of the left arm secondary to the splint.  ZEIDY TAYAG was evaluated in Emergency Department on 04/18/2019 for the symptoms described in the history of present illness. She was evaluated in the context of the global COVID-19 pandemic, which necessitated consideration that the patient might be at risk for infection with the SARS-CoV-2 virus that causes COVID-19. Institutional protocols and algorithms that pertain to the evaluation of patients at risk  for COVID-19 are in a state of rapid change based on information released by regulatory bodies including the CDC and federal and state organizations. These policies and algorithms were followed during the patient's care in the ED.  I reviewed the patient's prescription history over the last 12 months in the multi-state controlled substances database(s) that includes [place], [place], [place], [place], [place], [place], [place], [place], [place], [place], [place], [place], [place], and [place].  Results were notable for regular [facility] Rx.  ____________________________________________  FINAL CLINICAL IMPRESSION(S) / ED DIAGNOSES  Final diagnoses:  Closed fracture of left wrist, initial encounter      Melvenia Needles, PA-C 04/18/19 1619    Delman Kitten, MD 04/18/19 [phone]

## 2019-08-03 ENCOUNTER — Telehealth: Payer: Self-pay | Admitting: Pharmacist

## 2019-08-03 NOTE — Telephone Encounter (Signed)
08/03/2019 10:26:07 AM - Victoza & tips refill to [facility]  -- [name] - Monday, August 03, 2019 10:25 AM --[facility] refill for [employer] 1.2mg  once daily #4 boxes & Novofine 32G tips #2 boxes.

## 2019-09-17 ENCOUNTER — Telehealth: Payer: Self-pay | Admitting: Pharmacy Technician

## 2019-09-17 NOTE — Telephone Encounter (Signed)
Received updated proof of income.  Patient eligible to receive medication assistance at [facility] until time for re-certification in 9359, and as long as eligibility requirements continue to be met.  [place] [facility]

## 2019-09-28 ENCOUNTER — Other Ambulatory Visit: Payer: Medicaid Other

## 2019-10-13 ENCOUNTER — Other Ambulatory Visit: Payer: Self-pay | Admitting: Primary Care

## 2019-10-28 ENCOUNTER — Other Ambulatory Visit: Payer: Self-pay | Admitting: Primary Care

## 2019-11-19 ENCOUNTER — Emergency Department
Admission: EM | Admit: 2019-11-19 | Discharge: 2019-11-19 | Disposition: A | Discharge status: Home / Self Care | Payer: Self-pay | Attending: Emergency Medicine | Admitting: Emergency Medicine

## 2019-11-19 ENCOUNTER — Emergency Department: Payer: Self-pay

## 2019-11-19 ENCOUNTER — Other Ambulatory Visit: Payer: Self-pay

## 2019-11-19 ENCOUNTER — Encounter: Payer: Self-pay | Admitting: Emergency Medicine

## 2019-11-19 DIAGNOSIS — B349 Viral infection, unspecified: Secondary | ICD-10-CM | POA: Insufficient documentation

## 2019-11-19 DIAGNOSIS — E119 Type 2 diabetes mellitus without complications: Secondary | ICD-10-CM | POA: Insufficient documentation

## 2019-11-19 DIAGNOSIS — Z20822 Contact with and (suspected) exposure to covid-19: Secondary | ICD-10-CM | POA: Insufficient documentation

## 2019-11-19 DIAGNOSIS — F17210 Nicotine dependence, cigarettes, uncomplicated: Secondary | ICD-10-CM | POA: Insufficient documentation

## 2019-11-19 LAB — CBC WITH DIFFERENTIAL/PLATELET
Abs Immature Granulocytes: 0.06 10*3/uL (ref 0.00–0.07)
Basophils Absolute: 0.1 10*3/uL (ref 0.0–0.1)
Basophils Relative: 1 %
Eosinophils Absolute: 0.1 10*3/uL (ref 0.0–0.5)
Eosinophils Relative: 1 %
HCT: 44 % (ref 36.0–46.0)
Hemoglobin: 14.7 g/dL (ref 12.0–15.0)
Immature Granulocytes: 0 %
Lymphocytes Relative: 25 %
Lymphs Abs: 3.7 10*3/uL (ref 0.7–4.0)
MCH: 28.2 pg (ref 26.0–34.0)
MCHC: 33.4 g/dL (ref 30.0–36.0)
MCV: 84.5 fL (ref 80.0–100.0)
Monocytes Absolute: 0.8 10*3/uL (ref 0.1–1.0)
Monocytes Relative: 6 %
Neutro Abs: 9.9 10*3/uL — ABNORMAL HIGH (ref 1.7–7.7)
Neutrophils Relative %: 67 %
Platelets: 285 10*3/uL (ref 150–400)
RBC: 5.21 MIL/uL — ABNORMAL HIGH (ref 3.87–5.11)
RDW: 13 % (ref 11.5–15.5)
WBC: 14.7 10*3/uL — ABNORMAL HIGH (ref 4.0–10.5)
nRBC: 0 % (ref 0.0–0.2)

## 2019-11-19 LAB — COMPREHENSIVE METABOLIC PANEL
ALT: 29 U/L (ref 0–44)
AST: 10 U/L — ABNORMAL LOW (ref 15–41)
Albumin: 4.1 g/dL (ref 3.5–5.0)
Alkaline Phosphatase: 68 U/L (ref 38–126)
Anion gap: 11 (ref 5–15)
BUN: 13 mg/dL (ref 6–20)
CO2: 26 mmol/L (ref 22–32)
Calcium: 9.3 mg/dL (ref 8.9–10.3)
Chloride: 98 mmol/L (ref 98–111)
Creatinine, Ser: 0.84 mg/dL (ref 0.44–1.00)
GFR calc Af Amer: >60 mL/min (ref >60)
GFR calc non Af Amer: >60 mL/min (ref >60)
Glucose, Bld: 184 mg/dL — ABNORMAL HIGH (ref 70–99)
Potassium: 4 mmol/L (ref 3.5–5.1)
Sodium: 135 mmol/L (ref 135–145)
Total Bilirubin: 0.5 mg/dL (ref 0.3–1.2)
Total Protein: 8.1 g/dL (ref 6.5–8.1)

## 2019-11-19 LAB — SARS CORONAVIRUS 2 BY RT PCR (HOSPITAL ORDER, PERFORMED IN ~~LOC~~ HOSPITAL LAB): SARS Coronavirus 2: NEGATIVE

## 2019-11-19 LAB — LACTIC ACID, PLASMA: Lactic Acid, Venous: 1.9 mmol/L (ref 0.5–1.9)

## 2019-11-19 MED ORDER — PREDNISONE 50 MG PO TABS
50.0000 mg | ORAL_TABLET | Freq: Every day | ORAL | 0 refills | Status: DC
Start: 2019-11-19 — End: 2021-03-15

## 2019-11-19 MED ORDER — PSEUDOEPH-BROMPHEN-DM 30-2-10 MG/5ML PO SYRP
10.0000 mL | ORAL_SOLUTION | Freq: Four times a day (QID) | ORAL | 0 refills | Status: DC | PRN
Start: 2019-11-19 — End: 2021-03-15

## 2019-11-19 MED ORDER — LIDOCAINE VISCOUS HCL 2 % MT SOLN
15.0000 mL | Freq: Once | OROMUCOSAL | Status: AC
  Administered 2019-11-19: 15 mL via OROMUCOSAL
  Filled 2019-11-19: qty 15

## 2019-11-19 MED ORDER — LIDOCAINE VISCOUS HCL 2 % MT SOLN
10.0000 mL | OROMUCOSAL | 0 refills | Status: DC | PRN
Start: 2019-11-19 — End: 2019-12-21

## 2019-11-19 MED ORDER — IOHEXOL 300 MG/ML  SOLN
75.0000 mL | Freq: Once | INTRAMUSCULAR | Status: AC | PRN
  Administered 2019-11-19: 75 mL via INTRAVENOUS
  Filled 2019-11-19: qty 75

## 2019-11-19 MED ORDER — FLUTICASONE PROPIONATE 50 MCG/ACT NA SUSP
1.0000 | Freq: Two times a day (BID) | NASAL | 0 refills | Status: AC
Start: 2019-11-19 — End: ?

## 2019-11-19 MED ORDER — PREDNISONE 20 MG PO TABS
60.0000 mg | ORAL_TABLET | Freq: Once | ORAL | Status: AC
  Administered 2019-11-19: 60 mg via ORAL
  Filled 2019-11-19: qty 3

## 2019-11-19 NOTE — ED Provider Notes (Signed)
[facility] Emergency Department Provider Note  ____________________________________________  Time seen: Approximately 4:14 PM  I have reviewed the triage vital signs and the nursing notes.   HISTORY  Chief Complaint Sore Throat    HPI Angela Velasquez is a 44 y.o. female who presents the emergency department complaining of anterior neck pain.  Patient states that she took her first Covid vaccine shot 2 weeks ago.  She developed left submandibular pain, swelling within a day or 2 of taking the shot.  She had no fevers or chills, nasal congestion, sore throat, difficulty breathing or swallowing, cough, shortness of breath.  Patient states that this pain and swelling persisted for roughly a week, seemed to improve then returned and spread across the anterior neck.  She states that she has noticed some voice changes with sort of a scratchy/raspy voice.  She denies any sore throat but states that when she swallows it hurts along the anterior neck.  Patient denies any posterior neck pain.  No difficulty with rotation, flexion or extension of the neck.  She states that movement increases the pain along the anterior aspect of her neck.  She has had some nasal congestion, clear drainage out of the left eye, cough for the past several days.  No reported fevers or chills.  Patient reports pain with swallowing but no difficulty swallowing.  No chest pain, domino pain, nausea vomiting, diarrhea or constipation.         Past Medical History:  Diagnosis Date  . Diabetes mellitus without complication [facility](HCC)     Patient Active Problem List   Diagnosis Date Noted  . Hypoxia 08/21/2016    Past Surgical History:  Procedure Laterality Date  . TUBAL LIGATION      Prior to Admission medications   Medication Sig Start Date End Date Taking? Authorizing Provider  ADDERALL XR 30 MG 24 hr capsule Take 30 mg by mouth daily.  08/08/16   [provider]   brompheniramine-pseudoephedrine-DM 30-2-10 MG/5ML syrup Take 10 mLs by mouth 4 (four) times daily as needed. 11/19/19   Keylor Rands, Delorise RoyalsJonathan D, PA-C  clonazePAM (KLONOPIN) 2 MG tablet Take 2 mg by mouth at bedtime.  07/26/16   [provider]  cyclobenzaprine (FLEXERIL) 10 MG tablet Take 10 mg by mouth at bedtime as needed for muscle spasms.    [provider]  fluticasone (FLONASE) 50 MCG/ACT nasal spray Place 1 spray into both nostrils 2 (two) times daily. 11/19/19   Dae Highley, Delorise RoyalsJonathan D, PA-C  LANTUS SOLOSTAR 100 UNIT/ML Solostar Pen Inject 50 Units as directed at bedtime.  07/11/16   [provider]  LATUDA 40 MG TABS tablet Take 40 mg by mouth daily. 08/01/16   [provider]  lidocaine (XYLOCAINE) 2 % solution Use as directed 10 mLs in the mouth or throat every 4 (four) hours as needed for mouth pain. Swish, gargle, and spit 11/19/19   Keiyana Stehr, Christiane HaJonathan D, PA-C  liraglutide (VICTOZA) 18 MG/3ML SOPN Inject 1.2 mg into the skin daily.    [provider]  lisinopril (PRINIVIL,ZESTRIL) 10 MG tablet Take 10 mg by mouth daily. 07/11/16   [provider]  omeprazole (PRILOSEC) 20 MG capsule Take 20 mg by mouth daily. 07/11/16   [provider]  predniSONE (DELTASONE) 50 MG tablet Take 1 tablet (50 mg total) by mouth daily with breakfast. 11/19/19   Nash Bolls, Delorise RoyalsJonathan D, PA-C  rosuvastatin (CRESTOR) 10 MG tablet Take 10 mg by mouth daily.    [provider]  sitaGLIPtin-metformin (JANUMET) 50-1000 MG tablet Take 1 tablet by mouth 2 (two) times daily with a meal.    [provider]    Allergies Codeine  Family History  Problem Relation Age of Onset  . COPD Mother   . Cancer Maternal Grandmother   . Heart disease Maternal Grandmother     Social History Social History   Tobacco Use  . Smoking status: Current Every Day Smoker    Packs/day: 1.00    Years: 23.00    Pack years: 23.00    Types: Cigarettes  . Smokeless  tobacco: Never Used  . Tobacco comment: Has tried Chantix, gum, patches, and hard candy but none of that has worked for her. Mentioned Nicotrol product to her today. would need script from doctor to fill whenever she is ready to quit  Vaping Use  . Vaping Use: Never used  Substance Use Topics  . Alcohol use: Yes    Comment: 1-2 a month  . Drug use: Not Currently    Types: Cocaine    Comment: Sober x 13 years     Review of Systems  Constitutional: No fever/chills Eyes: No visual changes. No discharge ENT: Anterior neck pain, nasal congestion, voice changes Cardiovascular: no chest pain. Respiratory: Positive cough. No SOB. Gastrointestinal: No abdominal pain.  No nausea, no vomiting.  No diarrhea.  No constipation. Musculoskeletal: Negative for musculoskeletal pain. Skin: Negative for rash, abrasions, lacerations, ecchymosis. Neurological: Negative for headaches, focal weakness or numbness. 10-point ROS otherwise negative.  ____________________________________________   PHYSICAL EXAM:  VITAL SIGNS: ED Triage Vitals  Enc Vitals Group     BP 11/19/19 1233 134/75     Pulse Rate 11/19/19 1233 (!) 118     Resp 11/19/19 1233 16     Temp 11/19/19 1233 98.1 F (36.7 C)     Temp Source 11/19/19 1233 Oral     SpO2 11/19/19 1233 96 %     Weight 11/19/19 1235 246 lb 14.6 oz (112 kg)     Height 11/19/19 1235 5\' 7"  (1.702 m)     Head Circumference --      Peak Flow --      Pain Score 11/19/19 1235 0     Pain Loc --      Pain Edu? --      Excl. in GC? --      Constitutional: Alert and oriented. Well appearing and in no acute distress. Eyes: Conjunctivae are normal. PERRL. EOMI. Head: Atraumatic. ENT:      Ears:       Nose: No congestion/rhinnorhea.      Mouth/Throat: Mucous membranes are moist.  Neck: No stridor.  No cervical spine tenderness to palpation.  Visualization of the anterior neck reveals possible mild edema.  No gross erythema.  Patient is very tender to  palpation diffusely across the anterior neck with no palpable findings.  Mildly tender to palpation in the left submandibular region. Hematological/Lymphatic/Immunilogical: No cervical lymphadenopathy Cardiovascular: Normal rate, regular rhythm. Normal S1 and S2.  Good peripheral circulation. Respiratory: Normal respiratory effort without tachypnea or retractions. Lungs CTAB. Good air entry to the bases with no decreased or absent breath sounds. Gastrointestinal: Bowel sounds 4 quadrants. Soft and nontender to palpation. No guarding or rigidity. No palpable masses. No distention. No CVA tenderness. Musculoskeletal: Full range of motion to all extremities. No gross deformities appreciated. Neurologic:  Normal speech and language. No gross focal neurologic deficits are appreciated.  Skin:  Skin is warm,  dry and intact. No rash noted. Psychiatric: Mood and affect are normal. Speech and behavior are normal. Patient exhibits appropriate insight and judgement.   ____________________________________________   LABS (all labs ordered are listed, but only abnormal results are displayed)  Labs Reviewed  COMPREHENSIVE METABOLIC PANEL - Abnormal; Notable for the following components:      Result Value   Glucose, Bld 184 (*)    AST 10 (*)    All other components within normal limits  CBC WITH DIFFERENTIAL/PLATELET - Abnormal; Notable for the following components:   WBC 14.7 (*)    RBC 5.21 (*)    Neutro Abs 9.9 (*)    All other components within normal limits  SARS CORONAVIRUS 2 BY RT PCR (HOSPITAL ORDER, PERFORMED IN [facility])  LACTIC ACID, PLASMA  LACTIC ACID, PLASMA   ____________________________________________  EKG   ____________________________________________  RADIOLOGY I personally viewed and evaluated these images as part of my medical decision making, as well as reviewing the written report by the radiologist.  DG Chest 2 View  Result Date: 11/19/2019 CLINICAL  DATA:  Cough. EXAM: CHEST - 2 VIEW COMPARISON:  05/30/2017 FINDINGS: There is streaky bibasilar airspace opacities. No pneumothorax. No large pleural effusion. The heart size is unremarkable. IMPRESSION: Streaky bibasilar airspace opacities compatible with atelectasis or infiltrates. Electronically Signed   By: Katherine Mantle M.D.   On: 11/19/2019 17:51   CT Soft Tissue Neck W Contrast  Result Date: 11/19/2019 CLINICAL DATA:  Neck pain and which changes. EXAM: CT NECK WITH CONTRAST TECHNIQUE: Multidetector CT imaging of the neck was performed using the standard protocol following the bolus administration of intravenous contrast. CONTRAST:  36mL OMNIPAQUE IOHEXOL 300 MG/ML  SOLN COMPARISON:  None. FINDINGS: PHARYNX AND LARYNX: The nasopharynx, oropharynx and larynx are normal. Visible portions of the oral cavity, tongue base and floor of mouth are normal. Normal epiglottis, vallecula and pyriform sinuses. The larynx is normal. No retropharyngeal abscess, effusion or lymphadenopathy. SALIVARY GLANDS: Normal parotid, submandibular and sublingual glands. THYROID: Normal. LYMPH NODES: No enlarged or abnormal density lymph nodes. VASCULAR: Major cervical vessels are patent. LIMITED INTRACRANIAL: Normal. VISUALIZED ORBITS: Normal. MASTOIDS AND VISUALIZED PARANASAL SINUSES: No fluid levels or advanced mucosal thickening. No mastoid effusion. SKELETON: No bony spinal canal stenosis. No lytic or blastic lesions. UPPER CHEST: Clear. OTHER: None. IMPRESSION: Normal CT of the neck. Electronically Signed   By: Deatra Robinson M.D.   On: 11/19/2019 19:25    ____________________________________________    PROCEDURES  Procedure(s) performed:    Procedures    Medications  lidocaine (XYLOCAINE) 2 % viscous mouth solution 15 mL (has no administration in time range)  predniSONE (DELTASONE) tablet 60 mg (has no administration in time range)  iohexol (OMNIPAQUE) 300 MG/ML solution 75 mL (75 mLs Intravenous Contrast  Given 11/19/19 1848)     ____________________________________________   INITIAL IMPRESSION / ASSESSMENT AND PLAN / ED COURSE  Pertinent labs & imaging results that were available during my care of the patient were reviewed by me and considered in my medical decision making (see chart for details).  Review of the [place] CSRS was performed in accordance of the [facility] prior to dispensing any controlled drugs.           Patient's diagnosis is consistent with viral illness.  Patient presented to the emergency department complaining of anterior neck pain, cough, nasal congestion off and on x2 weeks.  Patient states that her symptoms began after she took her first Covid vaccine.  Patient states that her symptoms had lasted a week, seemed to improve and then worsened again.  No appreciable fevers or chills.  No difficulty breathing.  No abdominal complaints.  Differential included parotiditis, sialadenitis, Ludwick's angina, Lemierre's, strep, viral pharyngitis, viral URI, Covid.  Negative Covid test.  Imaging is reassuring.  Labs reveal mild elevation of white blood cell count.  I feel that symptoms are most likely consistent with viral illness.  Patient will be treated symptomatically.  Return precautions discussed at length with the patient.  Follow-up with primary care as needed.. Patient will be discharged home with prescriptions for prednisone, viscous lidocaine, Bromfed cough syrup, Flonase..  Patient is given ED precautions to return to the ED for any worsening or new symptoms.     ____________________________________________  FINAL CLINICAL IMPRESSION(S) / ED DIAGNOSES  Final diagnoses:  Viral illness      NEW MEDICATIONS STARTED DURING THIS VISIT:  ED Discharge Orders         Ordered    lidocaine (XYLOCAINE) 2 % solution  Every 4 hours PRN     Discontinue  Reprint     11/19/19 1953    predniSONE (DELTASONE) 50 MG tablet  Daily with breakfast     Discontinue  Reprint     11/19/19  1953    brompheniramine-pseudoephedrine-DM 30-2-10 MG/5ML syrup  4 times daily PRN     Discontinue  Reprint     11/19/19 1953    fluticasone (FLONASE) 50 MCG/ACT nasal spray  2 times daily     Discontinue  Reprint     11/19/19 1953              This chart was dictated using voice recognition software/Dragon. Despite best efforts to proofread, errors can occur which can change the meaning. Any change was purely unintentional.    Racheal Patches, PA-C 11/19/19 1954    Chesley Noon, MD 11/19/19 2108

## 2019-11-19 NOTE — ED Triage Notes (Signed)
C/O throat discomfort with inspiration and movement.  States initially started after first COVID vaccine.  AAOx3.  Skin warm and dry. NAD.  No SOB/ DOE.  Voice clear and strong.

## 2019-11-19 NOTE — ED Notes (Signed)
Computer broken in room.  Iv dc;'ed  meds given  Discharge inst to pt.  Pt signed  Esignature.

## 2019-12-21 ENCOUNTER — Emergency Department
Admission: EM | Admit: 2019-12-21 | Discharge: 2019-12-21 | Disposition: A | Discharge status: Home / Self Care | Payer: Self-pay | Attending: Emergency Medicine | Admitting: Emergency Medicine

## 2019-12-21 ENCOUNTER — Emergency Department: Payer: Self-pay

## 2019-12-21 ENCOUNTER — Other Ambulatory Visit: Payer: Self-pay

## 2019-12-21 DIAGNOSIS — M5416 Radiculopathy, lumbar region: Secondary | ICD-10-CM | POA: Insufficient documentation

## 2019-12-21 DIAGNOSIS — Z79899 Other long term (current) drug therapy: Secondary | ICD-10-CM | POA: Insufficient documentation

## 2019-12-21 DIAGNOSIS — E119 Type 2 diabetes mellitus without complications: Secondary | ICD-10-CM | POA: Insufficient documentation

## 2019-12-21 DIAGNOSIS — M545 Low back pain, unspecified: Secondary | ICD-10-CM

## 2019-12-21 DIAGNOSIS — F17210 Nicotine dependence, cigarettes, uncomplicated: Secondary | ICD-10-CM | POA: Insufficient documentation

## 2019-12-21 MED ORDER — DEXAMETHASONE SODIUM PHOSPHATE 10 MG/ML IJ SOLN
10.0000 mg | Freq: Once | INTRAMUSCULAR | Status: AC
  Administered 2019-12-21: 10 mg via INTRAMUSCULAR
  Filled 2019-12-21: qty 1

## 2019-12-21 MED ORDER — NAPROXEN 500 MG PO TABS
500.0000 mg | ORAL_TABLET | Freq: Two times a day (BID) | ORAL | 0 refills | Status: DC
Start: 2019-12-21 — End: 2021-03-15

## 2019-12-21 MED ORDER — ORPHENADRINE CITRATE 30 MG/ML IJ SOLN
60.0000 mg | Freq: Two times a day (BID) | INTRAMUSCULAR | Status: DC
  Administered 2019-12-21: 60 mg via INTRAMUSCULAR
  Filled 2019-12-21: qty 2

## 2019-12-21 MED ORDER — OXYCODONE-ACETAMINOPHEN 5-325 MG PO TABS
1.0000 | ORAL_TABLET | Freq: Once | ORAL | Status: AC
  Administered 2019-12-21: 1 via ORAL
  Filled 2019-12-21: qty 1

## 2019-12-21 MED ORDER — METHOCARBAMOL 500 MG PO TABS: 500.0000 mg | ORAL_TABLET | Freq: Four times a day (QID) | ORAL | 0 refills | Status: DC | PRN

## 2019-12-21 MED ORDER — HYDROCODONE-ACETAMINOPHEN 5-325 MG PO TABS: 1.0000 | ORAL_TABLET | Freq: Four times a day (QID) | ORAL | 0 refills | Status: DC | PRN

## 2019-12-21 NOTE — ED Triage Notes (Signed)
Pt c/o lower back pain that radiates into her legs since Friday , denies any known injury

## 2019-12-21 NOTE — Discharge Instructions (Addendum)
Follow-up with your primary care provider if any continued problems or concerns.  Also discussed with your primary care provider about physical therapy which may help your back.  Discontinue taking the Flexeril if you have any at home and begin taking medications that were sent to your pharmacy today.  You may also use ice or heat to your back as needed for discomfort.

## 2019-12-21 NOTE — ED Notes (Signed)
See triage note  Presents with lower back pain  States pain started on Friday w/o injury  Pain does move into both legs when she first stands  Denies any urinary or bowel issues   States she has used IBU ,and heat w/o relief

## 2019-12-21 NOTE — ED Provider Notes (Signed)
[facility] Emergency Department Provider Note   ____________________________________________   First MD Initiated Contact with Patient 12/21/19 1112     (approximate)  I have reviewed the triage vital signs and the nursing notes.   HISTORY  Chief Complaint Back Pain   HPI Angela Velasquez is a 44 y.o. female presents to the ED with complaint of low back pain that started 3 days ago without history of injury.  Patient states that the pain sometimes moves into her legs bilaterally.  She denies any saddle anesthesias or incontinence of bowel or bladder.  She has been using ibuprofen and heat without relief.  She states that there is been no x-rays done of her back.  She denies any urinary symptoms or history of UTIs.       Past Medical History:  Diagnosis Date  . Diabetes mellitus without complication [facility])     Patient Active Problem List   Diagnosis Date Noted  . Hypoxia 08/21/2016    Past Surgical History:  Procedure Laterality Date  . TUBAL LIGATION      Prior to Admission medications   Medication Sig Start Date End Date Taking? Authorizing Provider  ADDERALL XR 30 MG 24 hr capsule Take 30 mg by mouth daily.  08/08/16   [provider]  brompheniramine-pseudoephedrine-DM 30-2-10 MG/5ML syrup Take 10 mLs by mouth 4 (four) times daily as needed. 11/19/19   Cuthriell, Delorise Royals, PA-C  clonazePAM (KLONOPIN) 2 MG tablet Take 2 mg by mouth at bedtime.  07/26/16   [provider]  cyclobenzaprine (FLEXERIL) 10 MG tablet Take 10 mg by mouth at bedtime as needed for muscle spasms.    [provider]  fluticasone (FLONASE) 50 MCG/ACT nasal spray Place 1 spray into both nostrils 2 (two) times daily. 11/19/19   Cuthriell, Delorise Royals, PA-C  HYDROcodone-acetaminophen (NORCO/VICODIN) 5-325 MG tablet Take 1 tablet by mouth every 6 (six) hours as needed for moderate pain. 12/21/19   Tommi Rumps, PA-C  LANTUS SOLOSTAR 100 UNIT/ML Solostar  Pen Inject 50 Units as directed at bedtime.  07/11/16   [provider]  LATUDA 40 MG TABS tablet Take 40 mg by mouth daily. 08/01/16   [provider]  liraglutide (VICTOZA) 18 MG/3ML SOPN Inject 1.2 mg into the skin daily.    [provider]  lisinopril (PRINIVIL,ZESTRIL) 10 MG tablet Take 10 mg by mouth daily. 07/11/16   [provider]  methocarbamol (ROBAXIN) 500 MG tablet Take 1 tablet (500 mg total) by mouth every 6 (six) hours as needed. 12/21/19   Tommi Rumps, PA-C  naproxen (NAPROSYN) 500 MG tablet Take 1 tablet (500 mg total) by mouth 2 (two) times daily with a meal. 12/21/19   Tommi Rumps, PA-C  omeprazole (PRILOSEC) 20 MG capsule Take 20 mg by mouth daily. 07/11/16   [provider]  predniSONE (DELTASONE) 50 MG tablet Take 1 tablet (50 mg total) by mouth daily with breakfast. 11/19/19   Cuthriell, Delorise Royals, PA-C  rosuvastatin (CRESTOR) 10 MG tablet Take 10 mg by mouth daily.    [provider]  sitaGLIPtin-metformin (JANUMET) 50-1000 MG tablet Take 1 tablet by mouth 2 (two) times daily with a meal.    [provider]    Allergies Codeine  Family History  Problem Relation Age of Onset  . COPD Mother   . Cancer Maternal Grandmother   . Heart disease Maternal Grandmother     Social History Social History  Tobacco Use  . Smoking status: Current Every Day Smoker    Packs/day: 1.00    Years: 23.00    Pack years: 23.00    Types: Cigarettes  . Smokeless tobacco: Never Used  . Tobacco comment: Has tried Chantix, gum, patches, and hard candy but none of that has worked for her. Mentioned Nicotrol product to her today. would need script from doctor to fill whenever she is ready to quit  Vaping Use  . Vaping Use: Never used  Substance Use Topics  . Alcohol use: Yes    Comment: 1-2 a month  . Drug use: Not Currently    Types: Cocaine    Comment: Sober x 13 years    Review of Systems Constitutional: No  fever/chills Eyes: No visual changes. ENT: No sore throat. Cardiovascular: Denies chest pain. Respiratory: Denies shortness of breath. Gastrointestinal: No abdominal pain.  No nausea, no vomiting.  No diarrhea.  Genitourinary: Negative for dysuria. Musculoskeletal: Positive for low back pain with bilateral leg pain. Skin: Negative for rash. Neurological: Negative for headaches, focal weakness or numbness.  ____________________________________________   PHYSICAL EXAM:  VITAL SIGNS: ED Triage Vitals  Enc Vitals Group     BP 12/21/19 0942 (!) 123/95     Pulse Rate 12/21/19 0942 98     Resp 12/21/19 0942 17     Temp 12/21/19 0942 97.9 F (36.6 C)     Temp Source 12/21/19 0942 Oral     SpO2 12/21/19 0942 98 %     Weight 12/21/19 0942 240 lb (108.9 kg)     Height 12/21/19 0942 5\' 7"  (1.702 m)     Head Circumference --      Peak Flow --      Pain Score 12/21/19 0945 10     Pain Loc --      Pain Edu? --      Excl. in GC? --     Constitutional: Alert and oriented. Well appearing and in no acute distress. Eyes: Conjunctivae are normal.  Head: Atraumatic. Neck: No stridor.   Cardiovascular: Normal rate, regular rhythm. Grossly normal heart sounds.  Good peripheral circulation. Respiratory: Normal respiratory effort.  No retractions. Lungs CTAB. Gastrointestinal: Soft and nontender. No distention. Musculoskeletal: On examination of the back there is no gross deformity and there is no point tenderness on palpation of the thoracic spine.  There is diffuse tenderness noted on palpation of the lower lumbar spine.  No tenderness is noted SI joint areas bilaterally.  Straight leg raises were negative.  Good muscle strength bilaterally. Neurologic:  Normal speech and language.  Reflexes 1+ bilaterally.  No gross focal neurologic deficits are appreciated. No gait instability. Skin:  Skin is warm, dry and intact. No rash noted. Psychiatric: Mood and affect are normal. Speech and behavior are  normal.  ____________________________________________   LABS (all labs ordered are listed, but only abnormal results are displayed)  Labs Reviewed - No data to display  RADIOLOGY   Official radiology report(s): DG Lumbar Spine 2-3 Views  Result Date: 12/21/2019 CLINICAL DATA:  Pain with radiculopathy. EXAM: LUMBAR SPINE - 2-3 VIEW COMPARISON:  None. FINDINGS: There is no evidence of lumbar spine fracture. Alignment is normal. Minimal osteoarthritic changes at L2-L3 and L3-L4. Mild posterior facet arthropathy at L5-S1. IMPRESSION: 1. Minimal osteoarthritic changes at L2-L3 and L3-L4. 2. Mild posterior facet arthropathy at L5-S1. Electronically Signed   By: 02/20/2020 M.D.   On: 12/21/2019 12:24    ____________________________________________   PROCEDURES  Procedure(s) performed (including Critical Care):  Procedures   ____________________________________________   INITIAL IMPRESSION / ASSESSMENT AND PLAN / ED COURSE  As part of my medical decision making, I reviewed the following data within the electronic MEDICAL RECORD NUMBER Notes from prior ED visits and  Controlled Substance Database  Angela Velasquez was evaluated in Emergency Department on 12/21/2019 for the symptoms described in the history of present illness. She was evaluated in the context of the global COVID-19 pandemic, which necessitated consideration that the patient might be at risk for infection with the SARS-CoV-2 virus that causes COVID-19. Institutional protocols and algorithms that pertain to the evaluation of patients at risk for COVID-19 are in a state of rapid change based on information released by regulatory bodies including the CDC and federal and state organizations. These policies and algorithms were followed during the patient's care in the ED.  44 year old female presents to the ED with complaint of low back pain without history of injury.  Patient has pain radiating into her legs but no symptoms  that would suggest cauda equina.  Good muscle strength bilaterally.  X-rays show degenerative changes.  Patient was given Norflex 60 mg IM, Decadron 10 mg IM and Percocet prior to x-rays.  Patient was able to move easily after x-rays were done and was feeling somewhat better.  A prescription for hydrocodone, Robaxin and naproxen was sent to her pharmacy.  She is encouraged to use ice or heat to her back as needed for discomfort and follow-up with her PCP if any continued problems.  ____________________________________________   FINAL CLINICAL IMPRESSION(S) / ED DIAGNOSES  Final diagnoses:  Low back pain potentially associated with radiculopathy     ED Discharge Orders         Ordered    methocarbamol (ROBAXIN) 500 MG tablet  Every 6 hours PRN        12/21/19 1241    naproxen (NAPROSYN) 500 MG tablet  2 times daily with meals        12/21/19 1241    HYDROcodone-acetaminophen (NORCO/VICODIN) 5-325 MG tablet  Every 6 hours PRN        12/21/19 1241           Note:  This document was prepared using Dragon voice recognition software and may include unintentional dictation errors.    Tommi Rumps, PA-C 12/21/19 1811    Minna Antis, MD 12/31/19 [phone]

## 2019-12-23 ENCOUNTER — Other Ambulatory Visit: Payer: Self-pay | Admitting: Primary Care

## 2020-01-05 ENCOUNTER — Telehealth: Payer: Self-pay | Admitting: Pharmacist

## 2020-01-05 NOTE — Telephone Encounter (Signed)
01/05/2020 11:53:57 AM - Lantus Solostar renewal to [employer]  -- [name] - Tuesday, January 05, 2020 11:52 AM --Arneta Cliche [employer] renewal for Lantus Solostar Inject max 100 units daily # 6 boxes.

## 2020-01-29 ENCOUNTER — Other Ambulatory Visit: Payer: Self-pay

## 2020-04-12 ENCOUNTER — Other Ambulatory Visit: Payer: Self-pay | Admitting: Psychiatry

## 2020-04-28 ENCOUNTER — Other Ambulatory Visit: Payer: Self-pay | Admitting: Primary Care

## 2020-05-12 ENCOUNTER — Other Ambulatory Visit: Payer: Self-pay | Admitting: Primary Care

## 2020-05-26 ENCOUNTER — Other Ambulatory Visit: Payer: Self-pay

## 2020-06-23 ENCOUNTER — Telehealth: Payer: Self-pay | Admitting: Pharmacist

## 2020-06-23 NOTE — Telephone Encounter (Signed)
Patient failed to provide requested 2022 financial documentation. Unable to determine patient's eligibility status. No additional medication assistance will be provided by [facility] without the required proof of income documentation. Patient notified by letter.  Vonda Henderson [facility] Administrative Assistant 

## 2020-07-20 ENCOUNTER — Other Ambulatory Visit: Payer: Self-pay

## 2020-07-20 MED FILL — Lurasidone HCl Tab 80 MG: ORAL | 30 days supply | Qty: 30 | Fill #0 | Status: AC

## 2020-07-20 MED FILL — Sitagliptin-Metformin HCl Tab 50-1000 MG: ORAL | 90 days supply | Qty: 180 | Fill #0 | Status: AC

## 2020-07-21 ENCOUNTER — Other Ambulatory Visit: Payer: Self-pay

## 2020-07-22 ENCOUNTER — Other Ambulatory Visit: Payer: Self-pay

## 2020-07-25 ENCOUNTER — Other Ambulatory Visit: Payer: Self-pay

## 2020-07-26 ENCOUNTER — Other Ambulatory Visit: Payer: Self-pay

## 2020-07-26 MED FILL — Insulin Glargine Soln Pen-Injector 100 Unit/ML: SUBCUTANEOUS | 90 days supply | Qty: 90 | Fill #0 | Status: AC

## 2020-07-27 ENCOUNTER — Other Ambulatory Visit: Payer: Self-pay

## 2020-07-28 ENCOUNTER — Other Ambulatory Visit: Payer: Self-pay

## 2020-07-28 MED FILL — Insulin Pen Needle 32 G X 4 MM (1/6" or 5/32"): 30 days supply | Qty: 100 | Fill #0 | Status: AC

## 2020-07-29 ENCOUNTER — Other Ambulatory Visit: Payer: Self-pay

## 2020-08-12 ENCOUNTER — Other Ambulatory Visit: Payer: Self-pay

## 2020-08-15 ENCOUNTER — Other Ambulatory Visit: Payer: Self-pay

## 2020-08-25 ENCOUNTER — Other Ambulatory Visit: Payer: Self-pay

## 2020-08-25 MED FILL — Albuterol Sulfate Inhal Aero 108 MCG/ACT (90MCG Base Equiv): RESPIRATORY_TRACT | 17 days supply | Qty: 8.5 | Fill #0 | Status: AC

## 2020-08-25 MED FILL — Lurasidone HCl Tab 80 MG: ORAL | 30 days supply | Qty: 30 | Fill #1 | Status: AC

## 2020-08-30 ENCOUNTER — Other Ambulatory Visit: Payer: Self-pay

## 2020-08-30 ENCOUNTER — Telehealth: Payer: Self-pay | Admitting: Pharmacy Technician

## 2020-08-30 MED ORDER — FLUCONAZOLE 150 MG PO TABS
ORAL_TABLET | ORAL | 0 refills | Status: DC
  Filled 2020-08-30: qty 2, 3d supply, fill #0

## 2020-08-30 MED ORDER — ROSUVASTATIN CALCIUM 10 MG PO TABS
ORAL_TABLET | ORAL | 3 refills | Status: AC
  Filled 2020-08-30: qty 30, 30d supply, fill #0
  Filled 2020-12-02: qty 30, 30d supply, fill #1
  Filled 2021-01-18: qty 30, 30d supply, fill #2
  Filled 2021-03-06: qty 30, 30d supply, fill #3
  Filled 2021-04-14: qty 60, 60d supply, fill #4
  Filled 2021-07-11: qty 60, 60d supply, fill #5

## 2020-08-30 MED ORDER — LANTUS SOLOSTAR 100 UNIT/ML ~~LOC~~ SOPN
PEN_INJECTOR | SUBCUTANEOUS | 5 refills | Status: AC
  Filled 2020-08-30: qty 90, 112d supply, fill #0
  Filled 2020-10-21: qty 60, 75d supply, fill #0
  Filled 2021-01-27: qty 75, 93d supply, fill #1
  Filled 2021-06-16: qty 75, 93d supply, fill #2

## 2020-08-30 MED ORDER — METFORMIN HCL ER 500 MG PO TB24
ORAL_TABLET | Freq: Every day | ORAL | 3 refills | Status: DC
  Filled 2020-08-31: qty 120, 30d supply, fill #0
  Filled 2020-08-31: qty 360, 90d supply, fill #0

## 2020-08-30 MED ORDER — CYCLOBENZAPRINE HCL 10 MG PO TABS
ORAL_TABLET | ORAL | 1 refills | Status: AC
  Filled 2020-08-30: qty 30, 30d supply, fill #0
  Filled 2020-10-07: qty 30, 30d supply, fill #1

## 2020-08-30 MED ORDER — TRULICITY 0.75 MG/0.5ML ~~LOC~~ SOAJ
SUBCUTANEOUS | 3 refills | Status: DC
  Filled 2020-08-30: qty 2, 28d supply, fill #0
  Filled 2020-10-21: qty 2, 28d supply, fill #1
  Filled 2020-11-25: qty 8, 112d supply, fill #2
  Filled 2021-04-03: qty 8, 112d supply, fill #3

## 2020-08-30 MED ORDER — IBUPROFEN 800 MG PO TABS
ORAL_TABLET | ORAL | 3 refills | Status: DC
  Filled 2020-08-30: qty 90, 30d supply, fill #0
  Filled 2020-12-01: qty 90, 30d supply, fill #1

## 2020-08-30 MED ORDER — LISINOPRIL 10 MG PO TABS
ORAL_TABLET | ORAL | 3 refills | Status: AC
  Filled 2020-08-30: qty 30, 30d supply, fill #0
  Filled 2020-10-21: qty 30, 30d supply, fill #1
  Filled 2020-11-04: qty 30, 30d supply, fill #2
  Filled 2020-12-01: qty 90, 90d supply, fill #2
  Filled 2021-03-06: qty 90, 90d supply, fill #3
  Filled 2021-07-11: qty 90, 90d supply, fill #4

## 2020-08-30 MED ORDER — TERCONAZOLE 0.4 % VA CREA
TOPICAL_CREAM | VAGINAL | 0 refills | Status: DC
  Filled 2020-08-30 – 2020-08-31 (×2): qty 45, 7d supply, fill #0

## 2020-08-30 MED FILL — Insulin Pen Needle 32 G X 4 MM (1/6" or 5/32"): 30 days supply | Qty: 100 | Fill #1 | Status: AC

## 2020-08-30 NOTE — Telephone Encounter (Signed)
Received updated proof of income.  Patient eligible to receive medication assistance at [facility] until time for re-certification in 1610, and as long as eligibility requirements continue to be met.  [place] [facility]

## 2020-08-31 ENCOUNTER — Other Ambulatory Visit: Payer: Self-pay

## 2020-08-31 MED ORDER — METFORMIN HCL ER 500 MG PO TB24
ORAL_TABLET | ORAL | 3 refills | Status: DC
  Filled 2020-08-31: qty 120, 30d supply, fill #0
  Filled 2020-10-21: qty 120, 30d supply, fill #1
  Filled 2020-12-02: qty 120, 30d supply, fill #2
  Filled 2021-01-18: qty 120, 30d supply, fill #3
  Filled 2021-03-06: qty 120, 30d supply, fill #4
  Filled 2021-04-14: qty 120, 30d supply, fill #5
  Filled 2021-05-17: qty 120, 30d supply, fill #6
  Filled 2021-06-26: qty 120, 30d supply, fill #7
  Filled 2021-08-29: qty 60, 15d supply, fill #8

## 2020-09-01 ENCOUNTER — Other Ambulatory Visit: Payer: Self-pay

## 2020-09-02 ENCOUNTER — Other Ambulatory Visit: Payer: Self-pay

## 2020-09-05 ENCOUNTER — Other Ambulatory Visit: Payer: Self-pay

## 2020-09-14 ENCOUNTER — Other Ambulatory Visit: Payer: Self-pay

## 2020-09-14 MED ORDER — DICLOFENAC POTASSIUM 50 MG PO TABS: ORAL_TABLET | ORAL | 3 refills | Status: DC

## 2020-09-15 ENCOUNTER — Other Ambulatory Visit: Payer: Self-pay

## 2020-09-15 ENCOUNTER — Telehealth: Payer: Self-pay | Admitting: Pharmacist

## 2020-09-15 MED FILL — Omeprazole Cap Delayed Release 20 MG: ORAL | 90 days supply | Qty: 90 | Fill #0 | Status: AC

## 2020-09-15 NOTE — Telephone Encounter (Signed)
09/15/2020 12:14:13 PM - Janumet refill called in  -- [name] - Thursday, September 15, 2020 12:13 PM --Called in refill for Janumet 50/1000--allow 7-10 business days for [place] to receive at our office.

## 2020-09-16 ENCOUNTER — Other Ambulatory Visit: Payer: Self-pay

## 2020-09-19 ENCOUNTER — Other Ambulatory Visit: Payer: Self-pay

## 2020-09-19 MED ORDER — DICLOFENAC POTASSIUM 50 MG PO TABS
50.0000 mg | ORAL_TABLET | Freq: Two times a day (BID) | ORAL | 3 refills | Status: DC
  Filled 2020-09-19 – 2020-10-07 (×2): qty 40, 20d supply, fill #0
  Filled 2020-12-01: qty 40, 20d supply, fill #1
  Filled 2021-03-06: qty 40, 20d supply, fill #2

## 2020-09-21 ENCOUNTER — Other Ambulatory Visit: Payer: Self-pay

## 2020-10-03 ENCOUNTER — Other Ambulatory Visit: Payer: Self-pay

## 2020-10-03 MED FILL — Lurasidone HCl Tab 80 MG: ORAL | 30 days supply | Qty: 30 | Fill #2 | Status: AC

## 2020-10-03 MED FILL — Sitagliptin-Metformin HCl Tab 50-1000 MG: ORAL | 90 days supply | Qty: 180 | Fill #1 | Status: CN

## 2020-10-06 ENCOUNTER — Other Ambulatory Visit: Payer: Self-pay

## 2020-10-07 ENCOUNTER — Other Ambulatory Visit: Payer: Self-pay

## 2020-10-12 ENCOUNTER — Other Ambulatory Visit: Payer: Self-pay

## 2020-10-13 ENCOUNTER — Other Ambulatory Visit: Payer: Self-pay

## 2020-10-21 ENCOUNTER — Other Ambulatory Visit: Payer: Self-pay

## 2020-10-21 MED FILL — Lurasidone HCl Tab 80 MG: ORAL | 30 days supply | Qty: 30 | Fill #3 | Status: CN

## 2020-10-21 MED FILL — Albuterol Sulfate Inhal Aero 108 MCG/ACT (90MCG Base Equiv): RESPIRATORY_TRACT | 17 days supply | Qty: 8.5 | Fill #1 | Status: AC

## 2020-10-21 MED FILL — Insulin Glargine Soln Pen-Injector 100 Unit/ML: SUBCUTANEOUS | 60 days supply | Qty: 60 | Fill #1 | Status: CN

## 2020-10-21 MED FILL — Sitagliptin-Metformin HCl Tab 50-1000 MG: ORAL | 90 days supply | Qty: 180 | Fill #1 | Status: CN

## 2020-10-26 ENCOUNTER — Other Ambulatory Visit: Payer: Self-pay

## 2020-10-28 ENCOUNTER — Other Ambulatory Visit: Payer: Self-pay

## 2020-11-01 ENCOUNTER — Other Ambulatory Visit: Payer: Self-pay

## 2020-11-01 MED FILL — Lurasidone HCl Tab 80 MG: ORAL | 30 days supply | Qty: 30 | Fill #3 | Status: AC

## 2020-11-02 ENCOUNTER — Other Ambulatory Visit: Payer: Self-pay

## 2020-11-02 MED ORDER — PROAIR HFA 108 (90 BASE) MCG/ACT IN AERS
INHALATION_SPRAY | RESPIRATORY_TRACT | 10 refills | Status: DC
  Filled 2020-11-02: qty 8.5, fill #0
  Filled 2020-11-25: qty 34, 67d supply, fill #0
  Filled 2021-02-01: qty 34, 67d supply, fill #1
  Filled 2021-03-06: qty 25.5, 50d supply, fill #1
  Filled 2021-04-25: qty 25.5, 50d supply, fill #2

## 2020-11-04 ENCOUNTER — Other Ambulatory Visit: Payer: Self-pay

## 2020-11-08 ENCOUNTER — Other Ambulatory Visit: Payer: Self-pay

## 2020-11-25 ENCOUNTER — Other Ambulatory Visit: Payer: Self-pay

## 2020-11-28 ENCOUNTER — Other Ambulatory Visit: Payer: Self-pay

## 2020-12-01 ENCOUNTER — Other Ambulatory Visit: Payer: Self-pay

## 2020-12-01 MED FILL — Insulin Pen Needle 32 G X 4 MM (1/6" or 5/32"): 100 days supply | Qty: 100 | Fill #2 | Status: AC

## 2020-12-02 ENCOUNTER — Other Ambulatory Visit: Payer: Self-pay

## 2020-12-15 ENCOUNTER — Other Ambulatory Visit: Payer: Self-pay

## 2021-01-02 ENCOUNTER — Other Ambulatory Visit: Payer: Self-pay

## 2021-01-02 MED FILL — Lurasidone HCl Tab 80 MG: ORAL | 30 days supply | Qty: 30 | Fill #4 | Status: AC

## 2021-01-05 ENCOUNTER — Other Ambulatory Visit: Payer: Self-pay

## 2021-01-09 ENCOUNTER — Other Ambulatory Visit: Payer: Self-pay

## 2021-01-16 ENCOUNTER — Other Ambulatory Visit: Payer: Self-pay

## 2021-01-18 ENCOUNTER — Other Ambulatory Visit: Payer: Self-pay

## 2021-01-18 MED ORDER — OMEPRAZOLE 20 MG PO CPDR
DELAYED_RELEASE_CAPSULE | ORAL | 2 refills | Status: AC
  Filled 2021-01-18: qty 90, 90d supply, fill #0
  Filled 2021-05-17: qty 20, 20d supply, fill #1
  Filled 2021-05-17: qty 90, 90d supply, fill #1
  Filled 2021-06-26: qty 90, 90d supply, fill #2

## 2021-01-19 ENCOUNTER — Other Ambulatory Visit: Payer: Self-pay

## 2021-01-27 ENCOUNTER — Other Ambulatory Visit: Payer: Self-pay

## 2021-02-01 ENCOUNTER — Other Ambulatory Visit: Payer: Self-pay

## 2021-02-08 ENCOUNTER — Other Ambulatory Visit: Payer: Self-pay

## 2021-02-23 ENCOUNTER — Other Ambulatory Visit: Payer: Self-pay

## 2021-02-23 MED FILL — Lurasidone HCl Tab 80 MG: ORAL | 30 days supply | Qty: 30 | Fill #5 | Status: AC

## 2021-03-06 ENCOUNTER — Other Ambulatory Visit: Payer: Self-pay

## 2021-03-06 MED FILL — Insulin Pen Needle 32 G X 4 MM (1/6" or 5/32"): 100 days supply | Qty: 100 | Fill #3 | Status: AC

## 2021-03-14 ENCOUNTER — Other Ambulatory Visit: Payer: Self-pay

## 2021-03-15 ENCOUNTER — Ambulatory Visit: Payer: Medicaid Other

## 2021-03-15 ENCOUNTER — Encounter: Payer: Self-pay | Admitting: Pharmacist

## 2021-03-15 ENCOUNTER — Other Ambulatory Visit: Payer: Self-pay

## 2021-03-15 DIAGNOSIS — Z79899 Other long term (current) drug therapy: Secondary | ICD-10-CM

## 2021-03-15 NOTE — Progress Notes (Addendum)
[facility] Visit Note  Patient: Angela Velasquez MRN: [id number] Date of Birth: 01-Jul-1975 PCP: [facility] Angela Velasquez 45 y.o. female presents for a yearly MTM visit today. Patient identified with two patient identifiers (name and DOB)  LMP  (LMP Unknown)   Patient Information   Past Medical History:  Diagnosis Date   Diabetes mellitus without complication (HCC)       Past Surgical History:  Procedure Laterality Date   TUBAL LIGATION       Family History  Problem Relation Age of Onset   COPD Mother    Cancer Maternal Grandmother    Heart disease Maternal Grandmother     New Diagnoses (since last visit):   Family Support: Good  Lifestyle Diet: Breakfast:egg sandwich (sometimes with sausage)  Lunch:nuts  Dinner:meat, vegetables  Drinks:water, soda, coffee           Social History   Substance and Sexual Activity  Alcohol Use Yes   Comment: 1-2 a month      Social History   Tobacco Use  Smoking Status Every Day   Packs/day: 1.00   Years: 23.00   Pack years: 23.00   Types: Cigarettes  Smokeless Tobacco Never  Tobacco Comments   Has tried Chantix, gum, patches, and hard candy but none of that has worked for her. Mentioned Nicotrol product to her today. would need script from doctor to fill whenever she is ready to quit      Health Maintenance  Topic Date Due   COVID-19 Vaccine (1) Never done   Pneumococcal Vaccine 64-6 Years old (1 - PCV) Never done   Hepatitis C Screening  Never done   TETANUS/TDAP  Never done   PAP SMEAR-Modifier  Never done   COLONOSCOPY (Pts 45-53yrs Insurance coverage will need to be confirmed)  Never done   INFLUENZA VACCINE  Never done   HIV Screening  Completed   HPV VACCINES  Aged Out   Health Maintenance/Date Completed  Last ED visit: 08/29/2020 Last Visit to PCP: 05/12/2020 Next Visit to PCP: needs to schedule follow-up Specialist Visit: N/A Dental Exam: 2021 had  tooth pulled  Eye Exam: April 2022 Prostate Exam: N/A Pelvic/PAP Exam: is due, but none recent  Mammogram: is due, but none recent  DEXA: not received  Colonoscopy: not received  Flu Vaccine: not received  Pneumonia Vaccine: not received  COVID-19 Vaccine: received 2 shots  Shingrix Vaccine: not received  Outpatient Encounter Medications as of 03/15/2021  Medication Sig   clonazePAM (KLONOPIN) 2 MG tablet Take 2 mg by mouth at bedtime.    COMFORT EZ PEN NEEDLES 32G X 4 MM MISC USE AS DIRECTED   cyclobenzaprine (FLEXERIL) 10 MG tablet Take 10 mg by mouth at bedtime as needed for muscle spasms.   cyclobenzaprine (FLEXERIL) 10 MG tablet TAKE 1 TABLET BY MOUTH AT BEDTIME AS NEEDED TO RELAX MUSCLES   diclofenac (CATAFLAM) 50 MG tablet Take 1 tablet (50 mg total) by mouth 2 (two) times daily. Do not take with ibuprofen.   Dulaglutide (TRULICITY) 0.75 MG/0.5ML SOPN Inject 0.75 mg subcutaneously once a week for diabetes   fluticasone (FLONASE) 50 MCG/ACT nasal spray Place 1 spray into both nostrils 2 (two) times daily.   insulin glargine (LANTUS SOLOSTAR) 100 UNIT/ML Solostar Pen INJECT 80 UNITS UNDER THE SKIN ONCE DAILY.   lisinopril (ZESTRIL) 10 MG tablet TAKE ONE TABLET BY MOUTH EVERY DAY FOR KIDNEY PROTECTION   lurasidone (LATUDA) 80 MG  TABS tablet TAKE ONE TABLET BY MOUTH EVERY DAY WITH A MEAL   metFORMIN (GLUCOPHAGE-XR) 500 MG 24 hr tablet TAKE 4 TABLETS (2,000MG  TOTAL) BY MOUTH ONCE DAILY FOR DIABETES.   omeprazole (PRILOSEC) 20 MG capsule TAKE ONE CAPSULE BY MOUTH EVERY DAY FOR REFLUX   PROAIR HFA 108 (90 Base) MCG/ACT inhaler INHALE 1 OR 2 PUFFS EVERY 4-6 HOURS AS NEEDED FOR WHEEZING, COUGH OR SHORTNESS OF BREATH.   rosuvastatin (CRESTOR) 10 MG tablet Take 1 tablet by mouth every day for high cholesterol   lisinopril (PRINIVIL,ZESTRIL) 10 MG tablet Take 10 mg by mouth daily.   naproxen (NAPROSYN) 500 MG tablet Take 1 tablet (500 mg total) by mouth 2 (two) times daily with a meal.    omeprazole (PRILOSEC) 20 MG capsule Take 20 mg by mouth daily.   predniSONE (DELTASONE) 50 MG tablet Take 1 tablet (50 mg total) by mouth daily with breakfast.   rosuvastatin (CRESTOR) 10 MG tablet Take 10 mg by mouth daily.   sitaGLIPtin-metformin (JANUMET) 50-1000 MG tablet Take 1 tablet by mouth 2 (two) times daily with a meal.   terconazole (TERAZOL 7) 0.4 % vaginal cream Insert 1 applicatorful into vagina once every night for 7 days.   [DISCONTINUED] ADDERALL XR 30 MG 24 hr capsule Take 30 mg by mouth daily.  (Patient not taking: Reported on 03/15/2021)   [DISCONTINUED] brompheniramine-pseudoephedrine-DM 30-2-10 MG/5ML syrup Take 10 mLs by mouth 4 (four) times daily as needed.   [DISCONTINUED] fluconazole (DIFLUCAN) 150 MG tablet TAKE 1 TABLET BY MOUTH NOW FOR YEAST INFECTION, MAY REPEAT IN 3 DAYS IF SYMPTOMS PERSIST.   [DISCONTINUED] HYDROcodone-acetaminophen (NORCO/VICODIN) 5-325 MG tablet Take 1 tablet by mouth every 6 (six) hours as needed for moderate pain.   [DISCONTINUED] LANTUS SOLOSTAR 100 UNIT/ML Solostar Pen Inject 50 Units as directed at bedtime.    [DISCONTINUED] liraglutide (VICTOZA) 18 MG/3ML SOPN Inject 1.2 mg into the skin daily.   [DISCONTINUED] methocarbamol (ROBAXIN) 500 MG tablet Take 1 tablet (500 mg total) by mouth every 6 (six) hours as needed.   No facility-administered encounter medications on file as of 03/15/2021.       Assessment and Plan: Mediation adherence: patient reported good adherence to medications with no missed doses. Was abe to recall name, indication, and directions.  HTN --Currently receiving lisinopril and reports no issues  HLD --Currently receiving rosuvastatin and reports no issues  --Last LDL 72 03/22/2017 DM --Currently receiving Trulicity, 80 units of Lantus, and metformin and reports no issues  --Last A1c 8.1 06/27/2017 Allergies  --Currently receiving as needed Flonase and reports no issues  GERD --Currently receiving omeprazole and  reports no issues  Depression/anxiety --Currently receiving lurasidone and clonazepam and reports no issues   Patient was encouraged to schedule follow-up appointment with PCP. Patient encouraged to incorporate fruits and vegetables into diet. Patient was agreeable to all suggestions. Plan to return to clinic in 1 year for annual MTM.   RTC: 1 year  Doloris Hall, PharmD Pharmacy Resident  03/15/2021 11:03 AM

## 2021-03-20 IMAGING — CR DG LUMBAR SPINE 2-3V
3 series · 3 of 3 positions shown · non-contrast
Comparison: None.

CLINICAL DATA: Pain with radiculopathy.

EXAM:
LUMBAR SPINE - 2-3 VIEW

[l-spine ap]
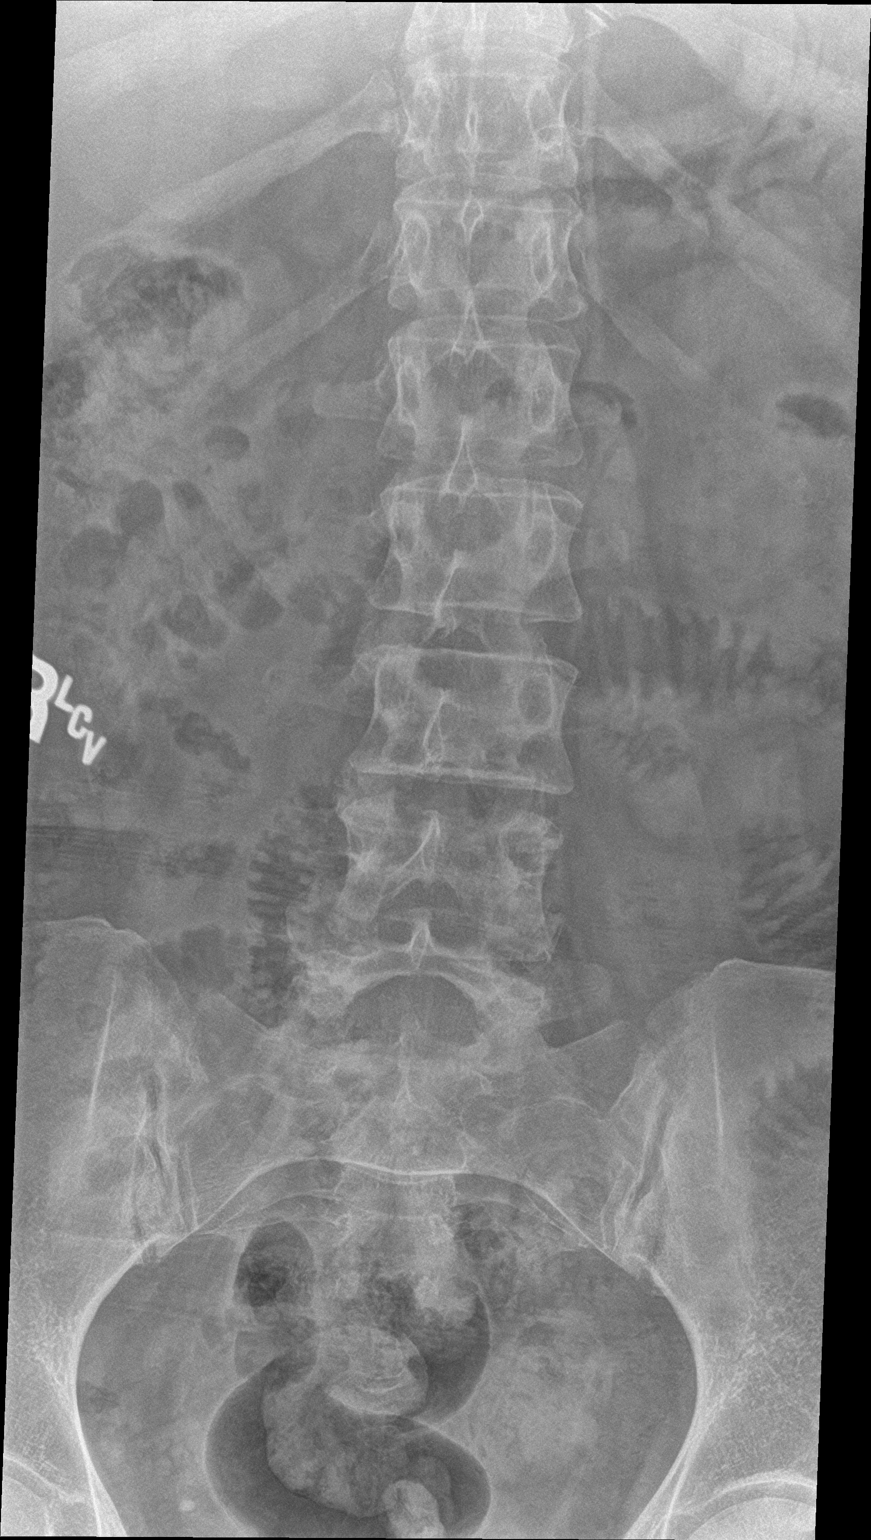

[l-spine lat]
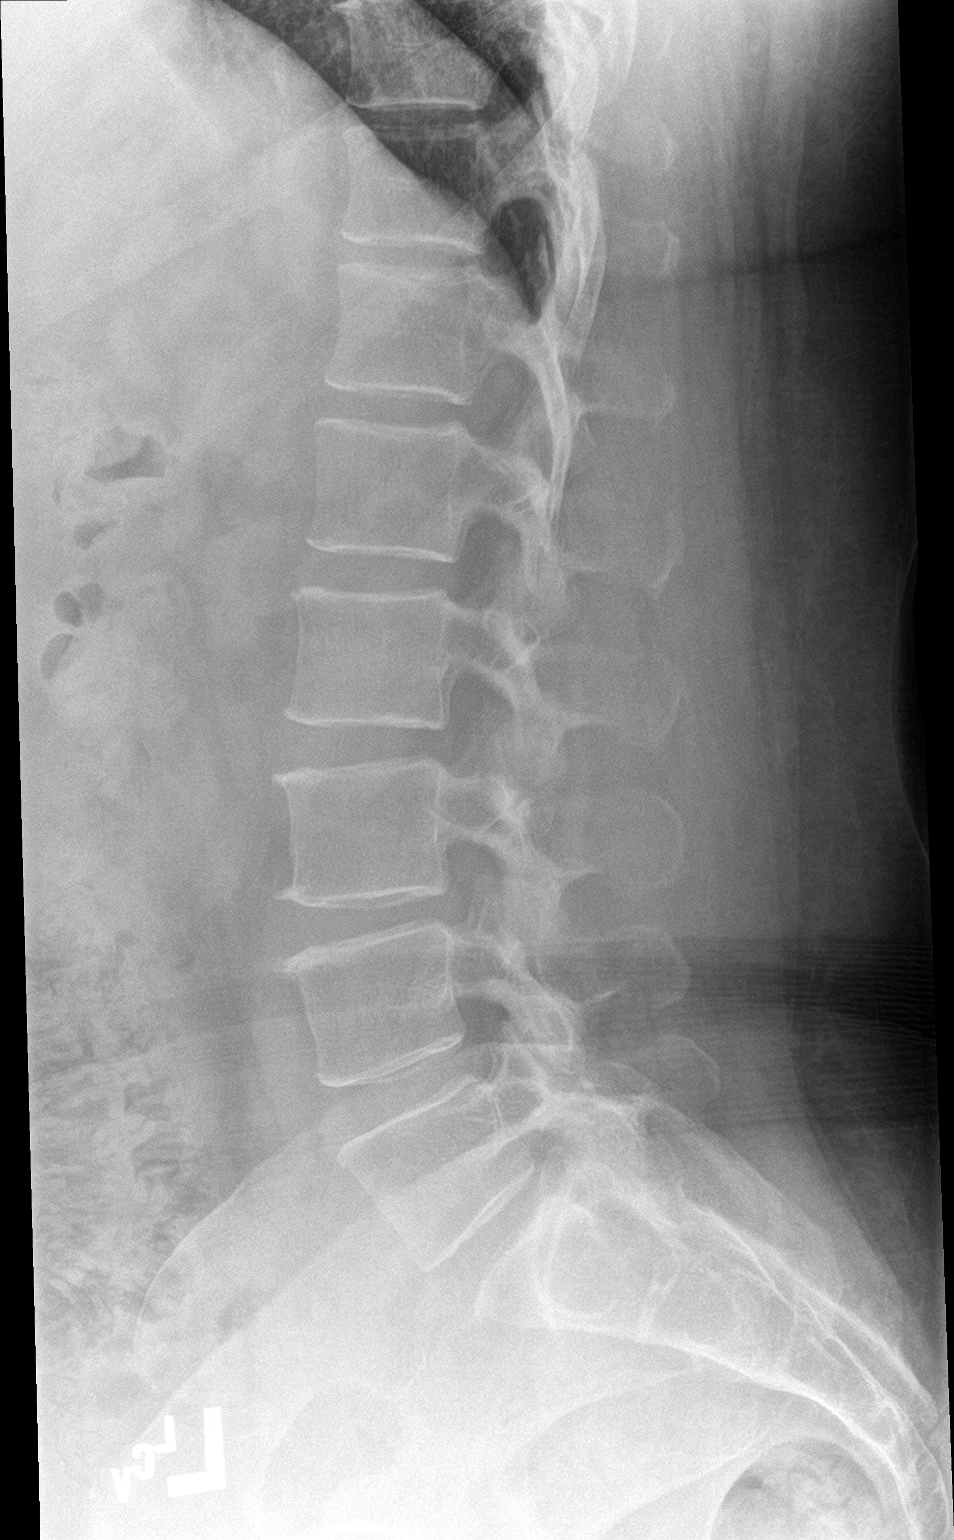

[l-spine spot]
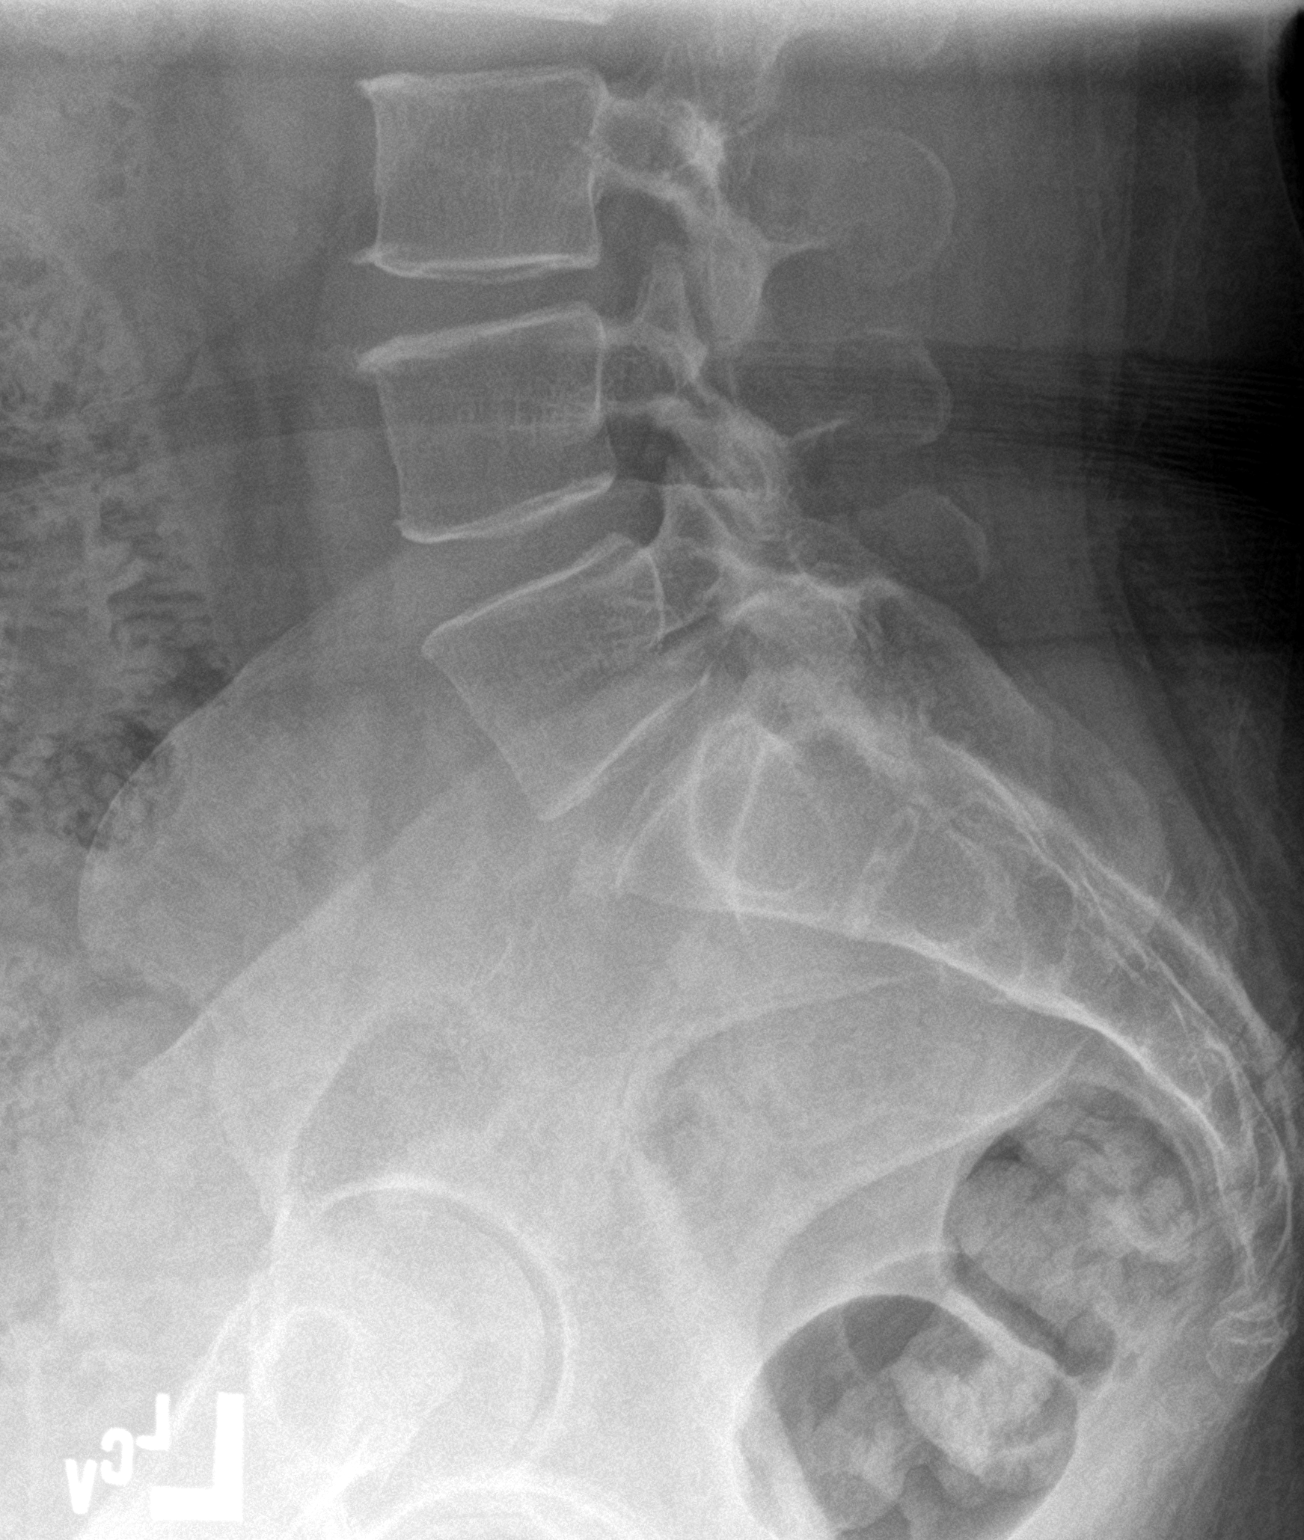

[3 of 3 positions shown; findings below may reference images not displayed]

FINDINGS: There is no evidence of lumbar spine fracture. Alignment is normal.
Minimal osteoarthritic changes at L2-L3 and L3-L4. Mild posterior
facet arthropathy at L5-S1.
IMPRESSION: 1. Minimal osteoarthritic changes at L2-L3 and L3-L4.
2. Mild posterior facet arthropathy at L5-S1.

## 2021-03-29 ENCOUNTER — Other Ambulatory Visit: Payer: Self-pay

## 2021-04-03 ENCOUNTER — Other Ambulatory Visit: Payer: Self-pay

## 2021-04-13 ENCOUNTER — Other Ambulatory Visit: Payer: Self-pay

## 2021-04-13 MED ORDER — LATUDA 80 MG PO TABS
ORAL_TABLET | ORAL | 3 refills | Status: AC
  Filled 2021-04-13: qty 30, 30d supply, fill #0
  Filled 2021-05-17: qty 30, 30d supply, fill #1
  Filled 2021-06-26: qty 30, 30d supply, fill #2
  Filled 2021-08-15: qty 30, 30d supply, fill #3
  Filled 2021-11-08: qty 120, 120d supply, fill #0

## 2021-04-14 ENCOUNTER — Other Ambulatory Visit: Payer: Self-pay

## 2021-04-25 ENCOUNTER — Other Ambulatory Visit: Payer: Self-pay

## 2021-04-26 ENCOUNTER — Other Ambulatory Visit: Payer: Self-pay

## 2021-05-16 ENCOUNTER — Other Ambulatory Visit: Payer: Self-pay

## 2021-05-17 ENCOUNTER — Other Ambulatory Visit: Payer: Self-pay

## 2021-05-24 ENCOUNTER — Telehealth: Payer: Self-pay | Admitting: Pharmacist

## 2021-05-24 NOTE — Telephone Encounter (Signed)
05/24/2021 12:18:41 PM - Lantus Solostar Renewal faxed -- Arletha Pili - Wednesday, May 24, 2021 12:15 PM --  Faxed [employer] renewal for [employer]. Inject 80 units daily #5 boxes.

## 2021-05-30 ENCOUNTER — Telehealth: Payer: Self-pay | Admitting: Pharmacist

## 2021-05-30 NOTE — Telephone Encounter (Signed)
05/30/2021 9:39:56 AM - Anette Guarneri to [facility] for refill -- Arletha Pili - Tuesday, May 30, 2021 9:29 AM -- Anette Guarneri refill faxed to [facility]. Last refill per [facility] last date to refill is 06-05-21. As it will no longer be available through PAP.

## 2021-06-07 ENCOUNTER — Other Ambulatory Visit: Payer: Self-pay

## 2021-06-08 ENCOUNTER — Other Ambulatory Visit: Payer: Self-pay

## 2021-06-16 ENCOUNTER — Other Ambulatory Visit: Payer: Self-pay

## 2021-06-20 ENCOUNTER — Other Ambulatory Visit: Payer: Self-pay

## 2021-06-20 MED ORDER — IBUPROFEN 800 MG PO TABS
ORAL_TABLET | ORAL | 3 refills | Status: AC
  Filled 2021-06-20: qty 90, 30d supply, fill #0

## 2021-06-20 MED ORDER — TRULICITY 1.5 MG/0.5ML ~~LOC~~ SOAJ
SUBCUTANEOUS | 3 refills | Status: AC
  Filled 2021-07-28: qty 8, 112d supply, fill #0

## 2021-06-21 ENCOUNTER — Other Ambulatory Visit: Payer: Self-pay

## 2021-06-21 ENCOUNTER — Telehealth: Payer: Self-pay | Admitting: Pharmacist

## 2021-06-21 NOTE — Telephone Encounter (Signed)
06/21/2021 2:18:05 PM - Trulicity dose increase forms to dr ?-- Tarry Kos - Wednesday, June 21, 2021 2:16 PM --  ?Received printout for Trulicity 1.5mg  (DOSE INCREASE). Forms to provider Sandrea Hughs, FNP ?

## 2021-06-21 NOTE — Telephone Encounter (Signed)
06/21/2021 12:03:06 PM - ProAir refill faxed to [facility] ?-- Tarry Kos - Wednesday, June 21, 2021 12:02 PM -- ?ProAir refill faxed to [facility].  ?

## 2021-06-26 ENCOUNTER — Other Ambulatory Visit: Payer: Self-pay

## 2021-06-27 ENCOUNTER — Emergency Department: Payer: Self-pay

## 2021-06-27 ENCOUNTER — Other Ambulatory Visit: Payer: Self-pay

## 2021-06-27 ENCOUNTER — Encounter: Payer: Self-pay | Admitting: Primary Care

## 2021-06-27 ENCOUNTER — Encounter: Payer: Self-pay | Admitting: Emergency Medicine

## 2021-06-27 ENCOUNTER — Emergency Department
Admission: EM | Admit: 2021-06-27 | Discharge: 2021-06-27 | Disposition: A | Discharge status: Home / Self Care | Payer: Self-pay | Attending: Emergency Medicine | Admitting: Emergency Medicine

## 2021-06-27 DIAGNOSIS — Z20822 Contact with and (suspected) exposure to covid-19: Secondary | ICD-10-CM | POA: Insufficient documentation

## 2021-06-27 DIAGNOSIS — R0981 Nasal congestion: Secondary | ICD-10-CM | POA: Insufficient documentation

## 2021-06-27 DIAGNOSIS — J3489 Other specified disorders of nose and nasal sinuses: Secondary | ICD-10-CM | POA: Insufficient documentation

## 2021-06-27 DIAGNOSIS — R112 Nausea with vomiting, unspecified: Secondary | ICD-10-CM | POA: Insufficient documentation

## 2021-06-27 DIAGNOSIS — J40 Bronchitis, not specified as acute or chronic: Secondary | ICD-10-CM | POA: Insufficient documentation

## 2021-06-27 LAB — URINALYSIS, ROUTINE W REFLEX MICROSCOPIC
Bacteria, UA: NONE SEEN
Bilirubin Urine: NEGATIVE
Glucose, UA: NEGATIVE mg/dL
Hgb urine dipstick: NEGATIVE
Ketones, ur: 5 mg/dL — AB
Leukocytes,Ua: NEGATIVE
Nitrite: NEGATIVE
Protein, ur: 30 mg/dL — AB
Specific Gravity, Urine: 1.028 (ref 1.005–1.030)
pH: 5 (ref 5.0–8.0)

## 2021-06-27 LAB — RESP PANEL BY RT-PCR (FLU A&B, COVID) ARPGX2
Influenza A by PCR: NEGATIVE
Influenza B by PCR: NEGATIVE
SARS Coronavirus 2 by RT PCR: NEGATIVE

## 2021-06-27 LAB — POC URINE PREG, ED: Preg Test, Ur: NEGATIVE

## 2021-06-27 MED ORDER — SODIUM CHLORIDE 0.9 % IV BOLUS
1000.0000 mL | Freq: Once | INTRAVENOUS | Status: AC
  Administered 2021-06-27: 1000 mL via INTRAVENOUS

## 2021-06-27 MED ORDER — METHYLPREDNISOLONE SODIUM SUCC 125 MG IJ SOLR
125.0000 mg | Freq: Once | INTRAMUSCULAR | Status: AC
  Administered 2021-06-27: 125 mg via INTRAMUSCULAR
  Filled 2021-06-27: qty 2

## 2021-06-27 MED ORDER — KETOROLAC TROMETHAMINE 30 MG/ML IJ SOLN
15.0000 mg | Freq: Once | INTRAMUSCULAR | Status: AC
  Administered 2021-06-27: 15 mg via INTRAVENOUS
  Filled 2021-06-27: qty 1

## 2021-06-27 MED ORDER — PSEUDOEPH-BROMPHEN-DM 30-2-10 MG/5ML PO SYRP: 10.0000 mL | ORAL_SOLUTION | Freq: Four times a day (QID) | ORAL | 0 refills | Status: DC | PRN

## 2021-06-27 MED ORDER — PREDNISONE 10 MG PO TABS: 50.0000 mg | ORAL_TABLET | Freq: Every day | ORAL | 0 refills | Status: AC

## 2021-06-27 MED ORDER — ONDANSETRON HCL 4 MG/2ML IJ SOLN
4.0000 mg | Freq: Once | INTRAMUSCULAR | Status: AC
  Administered 2021-06-27: 4 mg via INTRAVENOUS
  Filled 2021-06-27: qty 2

## 2021-06-27 MED ORDER — IPRATROPIUM-ALBUTEROL 0.5-2.5 (3) MG/3ML IN SOLN
3.0000 mL | Freq: Once | RESPIRATORY_TRACT | Status: AC
  Administered 2021-06-27: 3 mL via RESPIRATORY_TRACT
  Filled 2021-06-27: qty 3

## 2021-06-27 NOTE — ED Provider Notes (Signed)
? ?[facility] ?Provider Note ? ? ? Event Date/Time  ? First MD Initiated Contact with Patient 06/27/21 1719   ?  (approximate) ? ? ?History  ? ?Headache and Nausea ? ? ?HPI ? ?LAILONIE MUMPOWER is a 46 y.o. female with no chronic medical history and as listed in EMR presents to the emergency department for treatment and evaluation of headache, nausea, vomiting, cough, rhinorrhea.  Symptoms have been ongoing since Tuesday.  She has vomited twice today.  Husband recently had similar symptoms.  She has started amoxicillin that was prescribed to a friend and no relief after 2 days.. ? ?  ? ? ?Physical Exam  ? ?Triage Vital Signs: ?ED Triage Vitals  ?Enc Vitals Group  ?   BP 06/27/21 1707 (!) 141/85  ?   Pulse Rate 06/27/21 1707 (!) 107  ?   Resp 06/27/21 1707 20  ?   Temp 06/27/21 1707 98.2 ?F (36.8 ?C)  ?   Temp Source 06/27/21 1707 Oral  ?   SpO2 06/27/21 1707 95 %  ?   Weight --   ?   Height --   ?   Head Circumference --   ?   Peak Flow --   ?   Pain Score 06/27/21 1658 7  ?   Pain Loc --   ?   Pain Edu? --   ?   Excl. in [place]? --   ? ? ?Most recent vital signs: ?Vitals:  ? 06/27/21 1707 06/27/21 1931  ?BP: (!) 141/85   ?Pulse: (!) 112 93  ?Resp: 20   ?Temp: 98.2 ?F (36.8 ?C)   ?SpO2: 95%   ? ? ?General: Awake, no distress.  ?CV:  Good peripheral perfusion.  ?Resp:  Normal effort.  Wheezing throughout with rhonchi noted in the right lower lobe ?Abd:  No distention.  ?Other:   ? ? ?ED Results / Procedures / Treatments  ? ?Labs ?(all labs ordered are listed, but only abnormal results are displayed) ?Labs Reviewed  ?URINALYSIS, ROUTINE W REFLEX MICROSCOPIC - Abnormal; Notable for the following components:  ?    Result Value  ? Color, Urine YELLOW (*)   ? APPearance HAZY (*)   ? Ketones, ur 5 (*)   ? Protein, ur 30 (*)   ? All other components within normal limits  ?RESP PANEL BY RT-PCR (FLU A&B, COVID) ARPGX2  ?POC URINE PREG, ED  ? ? ? ?EKG ? ? ? ? ?RADIOLOGY ? ?Image and radiology report reviewed by  me. ? ?Chest x-ray negative for acute cardiopulmonary abnormality. ? ?PROCEDURES: ? ?Critical Care performed: No ? ?Procedures ? ? ?MEDICATIONS ORDERED IN ED: ?Medications  ?ipratropium-albuterol (DUONEB) 0.5-2.5 (3) MG/3ML nebulizer solution 3 mL (3 mLs Nebulization Given 06/27/21 1800)  ?sodium chloride 0.9 % bolus 1,000 mL (1,000 mLs Intravenous New Bag/Given 06/27/21 1800)  ?ondansetron (ZOFRAN) injection 4 mg (4 mg Intravenous Given 06/27/21 1757)  ?methylPREDNISolone sodium succinate (SOLU-MEDROL) 125 mg/2 mL injection 125 mg (125 mg Intramuscular Given 06/27/21 1800)  ?ketorolac (TORADOL) 30 MG/ML injection 15 mg (15 mg Intravenous Given 06/27/21 1757)  ? ? ? ?IMPRESSION / MDM / ASSESSMENT AND PLAN / ED COURSE  ? ?I have reviewed the triage note. ? ?Differential diagnosis includes, but is not limited to, bronchitis, COVID, influenza, migraine ? ?46 year old female presenting to the emergency department for treatment and evaluation of cough, congestion causing nausea, vomiting, headache for the past few days.  See HPI for further details. ? ?On  exam, patient does have significant scattered wheezes with rhonchi in the right lower lobe.  Meds and images have been ordered. ? ?Heart rate responded well to fluids and is now 93.  Patient feeling better.  COVID and influenza tests are negative.  Chest x-ray is without concern for pneumonia.  Plan will be to discharge her home with first dose of prednisone and Bromfed.  She states that she has albuterol at home.  She was encouraged to use it every 4-6 hours as needed for wheezing and cough.  She was encouraged to follow-up with her primary care provider if not improving over the week.  If symptoms change or worsen if she is unable to schedule appointment she was encouraged to return to the emergency department. ? ?  ? ? ?FINAL CLINICAL IMPRESSION(S) / ED DIAGNOSES  ? ?Final diagnoses:  ?Bronchitis  ? ? ? ?Rx / DC Orders  ? ?ED Discharge Orders   ? ?      Ordered  ?   predniSONE (DELTASONE) 10 MG tablet  Daily       ? 06/27/21 1930  ?  brompheniramine-pseudoephedrine-DM 30-2-10 MG/5ML syrup  4 times daily PRN       ? 06/27/21 1930  ? ?  ?  ? ?  ? ? ? ?Note:  This document was prepared using Dragon voice recognition software and may include unintentional dictation errors. ?  ?Victorino Dike, FNP ?06/27/21 1934 ? ?  ?Rada Hay, MD ?06/28/21 0011 ? ?

## 2021-06-27 NOTE — Discharge Instructions (Signed)
Please follow-up with your primary care provider if you are not feeling better over the next few days. ? ?Return to the emergency department for symptoms of change or worsen if you are unable to schedule an appointment. ?

## 2021-06-27 NOTE — ED Notes (Signed)
X-ray at bedside

## 2021-06-27 NOTE — ED Triage Notes (Addendum)
Pt to ED via POV with c/o Nausea and a headache that began Saturday and has been taking Amoxicillin (her friend gave it to her she was prescribed it) that is not helping. NAD ?

## 2021-07-03 ENCOUNTER — Other Ambulatory Visit: Payer: Self-pay

## 2021-07-03 MED ORDER — PROAIR HFA 108 (90 BASE) MCG/ACT IN AERS
INHALATION_SPRAY | RESPIRATORY_TRACT | 10 refills | Status: AC
  Filled 2021-07-03: qty 25.5, 50d supply, fill #0
  Filled 2021-07-11: qty 25.5, 75d supply, fill #0
  Filled 2021-07-11: qty 8.5, 30d supply, fill #0
  Filled 2021-07-11: qty 25.5, 75d supply, fill #0
  Filled 2021-07-28: qty 25.5, 50d supply, fill #0

## 2021-07-04 ENCOUNTER — Other Ambulatory Visit: Payer: Self-pay

## 2021-07-11 ENCOUNTER — Other Ambulatory Visit: Payer: Self-pay

## 2021-07-12 ENCOUNTER — Other Ambulatory Visit: Payer: Self-pay

## 2021-07-17 ENCOUNTER — Other Ambulatory Visit: Payer: Self-pay

## 2021-07-18 ENCOUNTER — Other Ambulatory Visit: Payer: Self-pay

## 2021-07-24 ENCOUNTER — Telehealth: Payer: Self-pay | Admitting: Pharmacist

## 2021-07-24 NOTE — Telephone Encounter (Signed)
07/24/2021 12:29:31 PM - Trulicity dose increase fax to [facility] ?-- Tarry Kos - Monday, July 24, 2021 12:28 PM --Trulicity dose increase faxed to [employer]. ?

## 2021-07-28 ENCOUNTER — Other Ambulatory Visit: Payer: Self-pay

## 2021-07-31 ENCOUNTER — Other Ambulatory Visit: Payer: Self-pay

## 2021-08-02 ENCOUNTER — Telehealth: Payer: Self-pay | Admitting: Pharmacy Technician

## 2021-08-02 NOTE — Telephone Encounter (Signed)
Received updated proof of income.  Patient eligible to receive medication assistance at [facility] until time for re-certification in 2024, and as long as eligibility requirements continue to be met. ? ? J.  ?Care Manager ?[facility]  ?

## 2021-08-03 ENCOUNTER — Other Ambulatory Visit: Payer: Self-pay

## 2021-08-10 ENCOUNTER — Other Ambulatory Visit: Payer: Self-pay

## 2021-08-15 ENCOUNTER — Other Ambulatory Visit: Payer: Self-pay

## 2021-08-17 ENCOUNTER — Other Ambulatory Visit: Payer: Self-pay

## 2021-08-23 ENCOUNTER — Other Ambulatory Visit: Payer: Self-pay

## 2021-08-29 ENCOUNTER — Other Ambulatory Visit: Payer: Self-pay

## 2021-08-30 ENCOUNTER — Other Ambulatory Visit: Payer: Self-pay

## 2021-09-19 ENCOUNTER — Other Ambulatory Visit: Payer: Self-pay

## 2021-09-21 ENCOUNTER — Other Ambulatory Visit: Payer: Self-pay

## 2021-09-21 MED ORDER — METFORMIN HCL ER 500 MG PO TB24
ORAL_TABLET | ORAL | 3 refills | Status: AC
  Filled 2021-09-21: qty 360, 90d supply, fill #0

## 2021-09-22 ENCOUNTER — Other Ambulatory Visit: Payer: Self-pay

## 2021-10-10 ENCOUNTER — Other Ambulatory Visit: Payer: Self-pay

## 2021-10-11 ENCOUNTER — Other Ambulatory Visit: Payer: Self-pay

## 2021-10-18 ENCOUNTER — Other Ambulatory Visit: Payer: Self-pay

## 2021-10-31 ENCOUNTER — Emergency Department
Admission: EM | Admit: 2021-10-31 | Discharge: 2021-10-31 | Disposition: A | Discharge status: Home / Self Care | Payer: Self-pay | Attending: Emergency Medicine | Admitting: Emergency Medicine

## 2021-10-31 ENCOUNTER — Encounter: Payer: Self-pay | Admitting: Medical Oncology

## 2021-10-31 ENCOUNTER — Other Ambulatory Visit: Payer: Self-pay

## 2021-10-31 ENCOUNTER — Emergency Department: Payer: Medicaid Other

## 2021-10-31 DIAGNOSIS — Z23 Encounter for immunization: Secondary | ICD-10-CM | POA: Insufficient documentation

## 2021-10-31 DIAGNOSIS — L089 Local infection of the skin and subcutaneous tissue, unspecified: Secondary | ICD-10-CM | POA: Insufficient documentation

## 2021-10-31 DIAGNOSIS — W1839XA Other fall on same level, initial encounter: Secondary | ICD-10-CM | POA: Insufficient documentation

## 2021-10-31 DIAGNOSIS — S8011XA Contusion of right lower leg, initial encounter: Secondary | ICD-10-CM | POA: Insufficient documentation

## 2021-10-31 MED ORDER — OXYCODONE-ACETAMINOPHEN 7.5-325 MG PO TABS
1.0000 | ORAL_TABLET | Freq: Once | ORAL | Status: AC
  Administered 2021-10-31: 1 via ORAL
  Filled 2021-10-31: qty 1

## 2021-10-31 MED ORDER — TETANUS-DIPHTH-ACELL PERTUSSIS 5-2.5-18.5 LF-MCG/0.5 IM SUSY
0.5000 mL | PREFILLED_SYRINGE | Freq: Once | INTRAMUSCULAR | Status: AC
  Administered 2021-10-31: 0.5 mL via INTRAMUSCULAR
  Filled 2021-10-31: qty 0.5

## 2021-10-31 MED ORDER — OXYCODONE-ACETAMINOPHEN 5-325 MG PO TABS: 1.0000 | ORAL_TABLET | Freq: Four times a day (QID) | ORAL | 0 refills | Status: AC | PRN

## 2021-10-31 MED ORDER — CEPHALEXIN 500 MG PO CAPS: 500.0000 mg | ORAL_CAPSULE | Freq: Three times a day (TID) | ORAL | 0 refills | Status: AC

## 2021-10-31 NOTE — ED Notes (Signed)
See triage note  Presents s/p fall  last Friday  States she slid in some mud  Abrasions noted right knee and leg

## 2021-10-31 NOTE — ED Triage Notes (Signed)
Pt reports that she slipped in some mud Friday and fell hurting Rt knee and rt ankle. Denies head injury.

## 2021-10-31 NOTE — Discharge Instructions (Signed)
Clean area daily with mild soap and water.  Elevate foot to reduce swelling.  Clean abrasions daily with mild soap and water and allowed to dry completely.  Begin taking antibiotic until completely finished.  A prescription for pain medication was sent to the pharmacy to take as needed.  This is a narcotic and should not be taken while driving or operating machinery.  Follow-up with your primary care provider if any continued problems or concerns.

## 2021-10-31 NOTE — ED Provider Notes (Signed)
[facility] Provider Note    Event Date/Time   First MD Initiated Contact with Patient 10/31/21 [phone]     (approximate)   History   Fall   HPI  Angela Velasquez is a 46 y.o. female   presents to the ED with complaint of right lower extremity pain.  Patient states that she fell due to to some mud 4 days ago and has had pain since that time.  She denies any head injury or loss of consciousness.  She has been putting ointment on the multiple abrasions that she got to the anterior lower extremity.  She reports increased pain today despite over-the-counter medication.       Physical Exam   Triage Vital Signs: ED Triage Vitals  Enc Vitals Group     BP 10/31/21 0848 121/83     Pulse Rate 10/31/21 0848 (!) 110     Resp 10/31/21 0848 20     Temp 10/31/21 0848 98.2 F (36.8 C)     Temp Source 10/31/21 0848 Oral     SpO2 10/31/21 0848 94 %     Weight 10/31/21 0846 238 lb (108 kg)     Height 10/31/21 0846 5\' 7"  (1.702 m)     Head Circumference --      Peak Flow --      Pain Score 10/31/21 0845 10     Pain Loc --      Pain Edu? --      Excl. in [place]? --     Most recent vital signs: Vitals:   10/31/21 0848  BP: 121/83  Pulse: (!) 110  Resp: 20  Temp: 98.2 F (36.8 C)  SpO2: 94%     General: Awake, no distress.  Alert, talkative. CV:  Good peripheral perfusion.  Heart regular rate and rhythm. Resp:  Normal effort.  Lungs are clear bilaterally. Abd:  No distention.  Other:  Right lower extremity with multiple areas of abrasion anteriorly.  The dorsal foot area has a large abrasion with erythema, tenderness and swelling that is concerning for infection.  Pulses present.  Motor or sensory function intact, capillary fill less than 3 seconds.  Patient is able to bear some weight but has an antalgic gait due to injury.   ED Results / Procedures / Treatments   Labs (all labs ordered are listed, but only abnormal results are displayed) Labs Reviewed - No  data to display    RADIOLOGY Right tib-fib x-ray images were reviewed by me and interpreted prior to radiology reading which was negative for bony injury.  Radiology report agrees.  Right foot x-ray is negative for acute bony injury.  Radiology report is negative.    PROCEDURES:  Critical Care performed:   Procedures   MEDICATIONS ORDERED IN ED: Medications  Tdap (BOOSTRIX) injection 0.5 mL (0.5 mLs Intramuscular Given 10/31/21 0933)  oxyCODONE-acetaminophen (PERCOCET) 7.5-325 MG per tablet 1 tablet (1 tablet Oral Given 10/31/21 0934)     IMPRESSION / MDM / ASSESSMENT AND PLAN / ED COURSE  I reviewed the triage vital signs and the nursing notes.   Differential diagnosis includes, but is not limited to, fracture right lower extremity, fracture right foot, multiple abrasions, infected wounds, contusion.  46 year old female presents to the ED with complaint of right lower extremity pain.  Patient fell due to some mud approximately 4 days ago and has had pain since that time.  Multiple abrasions are noted and seem to be getting infected with  erythema on the right foot.  No active drainage.  Patient was reassured as x-rays were negative for acute fracture.  Patient is encouraged to elevate and to reduce swelling.  We discussed wound management and also begin taking the antibiotic that was prescribed for her for infection.  A prescription for Percocet was sent to the pharmacy to take as needed for pain.  Patient is to call and make an appoint with her primary care provider if any continued problems or concerns.      Patient's presentation is most consistent with acute complicated illness / injury requiring diagnostic workup.  FINAL CLINICAL IMPRESSION(S) / ED DIAGNOSES   Final diagnoses:  Contusion of multiple sites of right lower extremity, initial encounter  Abrasions of multiple sites with infection     Rx / DC Orders   ED Discharge Orders          Ordered    cephALEXin  (KEFLEX) 500 MG capsule  3 times daily        10/31/21 1004    oxyCODONE-acetaminophen (PERCOCET) 5-325 MG tablet  Every 6 hours PRN        10/31/21 1004             Note:  This document was prepared using Dragon voice recognition software and may include unintentional dictation errors.   Tommi Rumps, PA-C 10/31/21 1011    Gilles Chiquito, MD 10/31/21 1540

## 2021-11-07 ENCOUNTER — Encounter: Payer: Self-pay | Admitting: Primary Care

## 2021-11-08 ENCOUNTER — Other Ambulatory Visit: Payer: Self-pay

## 2021-11-27 ENCOUNTER — Other Ambulatory Visit: Payer: Self-pay

## 2021-12-13 ENCOUNTER — Ambulatory Visit: Payer: Medicaid Other

## 2022-01-19 ENCOUNTER — Other Ambulatory Visit: Payer: Self-pay

## 2022-01-19 DIAGNOSIS — Z1231 Encounter for screening mammogram for malignant neoplasm of breast: Secondary | ICD-10-CM

## 2022-01-23 ENCOUNTER — Ambulatory Visit: Payer: Medicaid Other

## 2022-03-06 ENCOUNTER — Ambulatory Visit: Payer: Medicaid Other

## 2022-05-14 ENCOUNTER — Ambulatory Visit: Payer: Medicaid Other

## 2022-06-19 ENCOUNTER — Ambulatory Visit: Payer: Medicaid Other | Attending: Hematology and Oncology | Admitting: Hematology and Oncology

## 2022-06-19 ENCOUNTER — Ambulatory Visit
Admission: RE | Admit: 2022-06-19 | Discharge: 2022-06-19 | Disposition: A | Discharge status: Home / Self Care | Payer: Medicaid Other | Source: Ambulatory Visit | Attending: Obstetrics and Gynecology | Admitting: Obstetrics and Gynecology

## 2022-06-19 VITALS — BP 144/89 | Wt 230.1 lb

## 2022-06-19 DIAGNOSIS — Z1231 Encounter for screening mammogram for malignant neoplasm of breast: Secondary | ICD-10-CM | POA: Diagnosis present

## 2022-06-19 NOTE — Patient Instructions (Signed)
Sonoita about self breast awareness and gave educational materials to take home. Patient did not need a Pap smear today due to last Pap smear was in  per patient. Let her know [facility] will cover Pap smears every 5 years unless has a history of abnormal Pap smears. [facility]. Appointment scheduled for 06/19/22. Patient aware of appointment and will be there. Let patient know will follow up with her within the next couple weeks with results. Angela Velasquez verbalized understanding.  Melodye Ped, NP 10:31 AM

## 2022-06-19 NOTE — Progress Notes (Signed)
Angela Velasquez is a 47 y.o. female who presents to [facility] clinic today with no complaints.    Pap Smear: Pap not smear completed today. Last Pap smear was 2023 at [facility] clinic and was normal. Per patient has no history of an abnormal Pap smear. Last Pap smear result is available in Epic.   Physical exam: Breasts Breasts symmetrical. No skin abnormalities bilateral breasts. No nipple retraction bilateral breasts. No nipple discharge bilateral breasts. No lymphadenopathy. No lumps palpated bilateral breasts.       Pelvic/Bimanual Pap is not indicated today    Smoking History: Patient has is a current smoker at 1 packs per day and was referred to quit line.    Patient Navigation: Patient education provided. Access to services provided for patient through [facility] program. No interpreter provided. No transportation provided   Colorectal Cancer Screening: Per patient has never had colonoscopy completed No complaints today. FIT test given per Princella Ion.   Breast and Cervical Cancer Risk Assessment: Patient does not have family history of breast cancer, known genetic mutations, or radiation treatment to the chest before age 35. Patient does not have history of cervical dysplasia, immunocompromised, or DES exposure in-utero.  Risk Scores as of 06/19/2022     Baker Janus           5-year 0.81 %   Lifetime 8.28 %            Last calculated by Demetrius Revel, LPN on [id number] at [id number] AM        A: [facility] exam without pap smear No complaints with benign exam.   P: [facility]. Appointment scheduled 06/19/22.  Melodye Ped, NP 06/19/2022 10:39 AM

## 2022-08-31 ENCOUNTER — Other Ambulatory Visit: Payer: Self-pay

## 2022-09-03 ENCOUNTER — Other Ambulatory Visit: Payer: Self-pay

## 2022-09-25 IMAGING — DX DG CHEST 1V
1 series · 1 of 1 positions shown · non-contrast
Comparison: Chest radiograph dated November 19, 2019

CLINICAL DATA: Cough, wheezing, congestion

EXAM:
CHEST  1 VIEW

[chest ap]
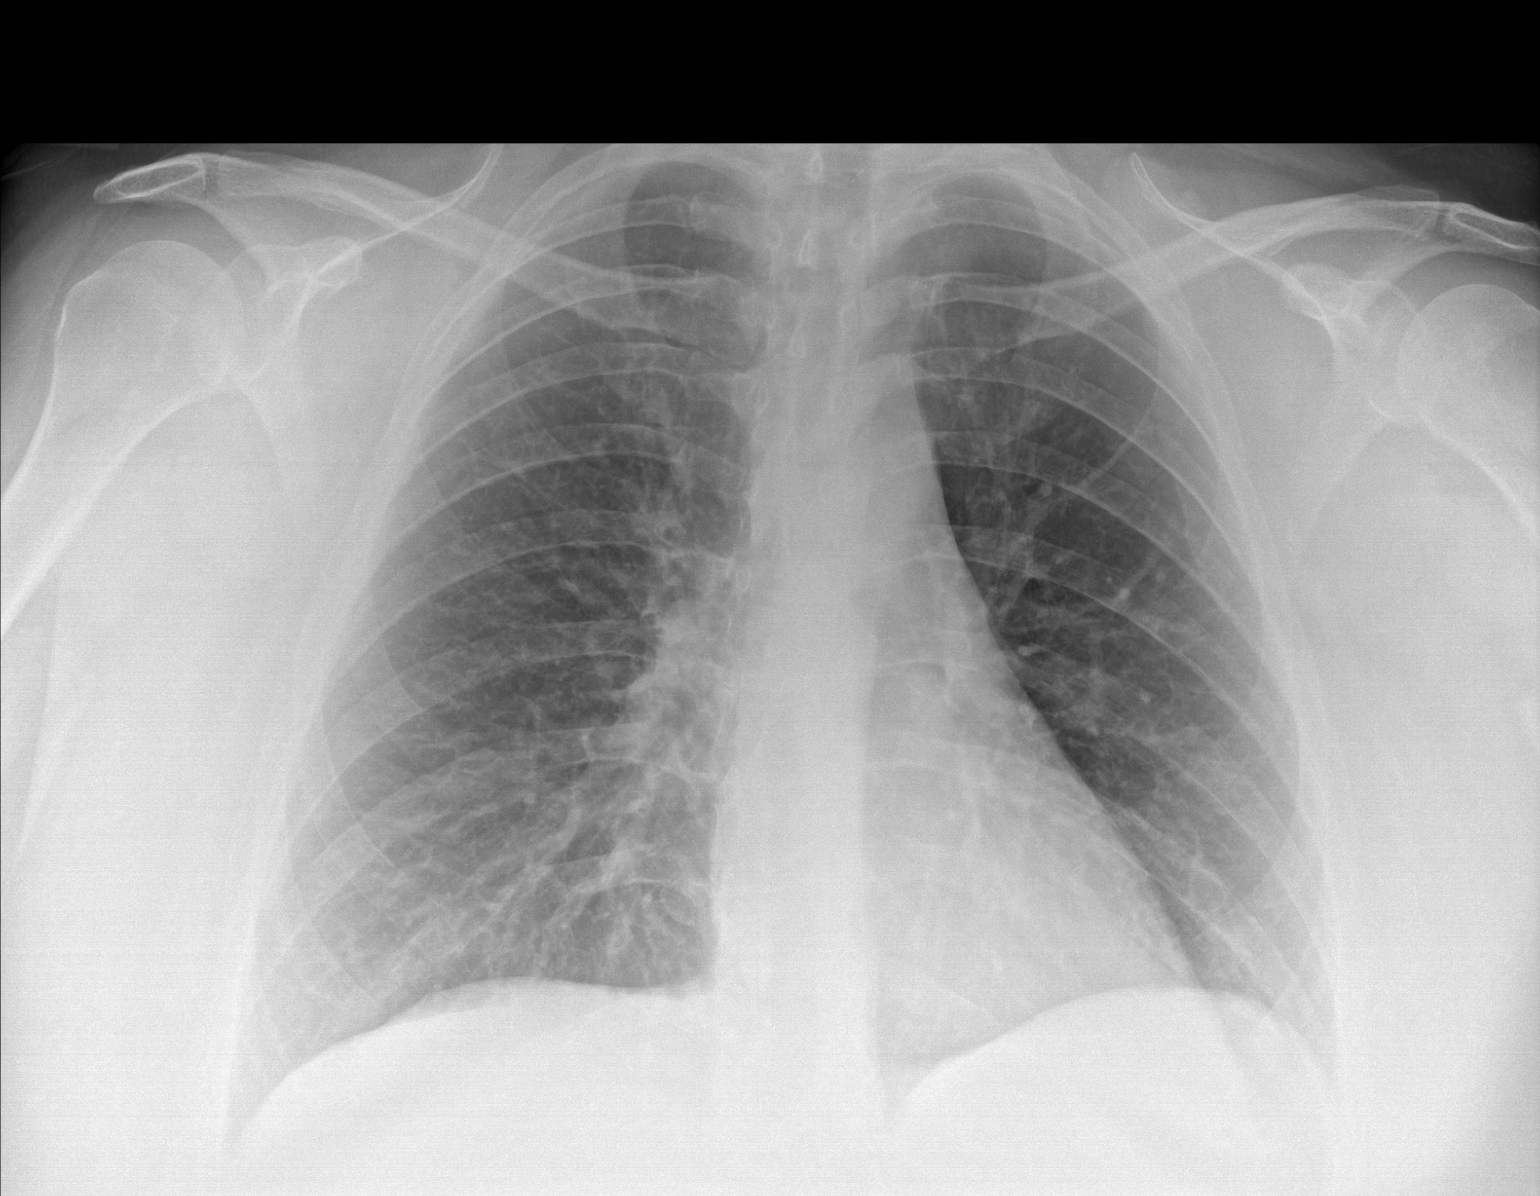

[1 of 1 positions shown; findings below may reference images not displayed]

FINDINGS: The heart size and mediastinal contours are within normal limits.
Both lungs are clear. The visualized skeletal structures are
unremarkable.
IMPRESSION: No active disease.
